# Patient Record
Sex: Female | Born: 1965 | Race: White | Hispanic: No | Marital: Married | Smoking: Never smoker
Health system: Southern US, Community
[De-identification: ages and names within clinical notes are randomized; demographics above are authoritative.]

## PROBLEM LIST (undated history)

## (undated) DIAGNOSIS — R195 Other fecal abnormalities: Secondary | ICD-10-CM

## (undated) DIAGNOSIS — K649 Unspecified hemorrhoids: Secondary | ICD-10-CM

## (undated) DIAGNOSIS — Z309 Encounter for contraceptive management, unspecified: Secondary | ICD-10-CM

## (undated) DIAGNOSIS — R350 Frequency of micturition: Principal | ICD-10-CM

## (undated) HISTORY — DX: Frequency of micturition: R35.0

## (undated) HISTORY — DX: Encounter for contraceptive management, unspecified: Z30.9

## (undated) HISTORY — DX: Other fecal abnormalities: R19.5

## (undated) HISTORY — DX: Unspecified hemorrhoids: K64.9

---

## 2000-05-24 ENCOUNTER — Other Ambulatory Visit: Admission: RE | Admit: 2000-05-24 | Discharge: 2000-05-24 | Payer: Self-pay | Admitting: Obstetrics and Gynecology

## 2000-07-12 ENCOUNTER — Other Ambulatory Visit: Admission: RE | Admit: 2000-07-12 | Discharge: 2000-07-12 | Payer: Self-pay | Admitting: Obstetrics and Gynecology

## 2001-05-24 ENCOUNTER — Encounter: Payer: Self-pay | Admitting: Obstetrics & Gynecology

## 2001-05-24 ENCOUNTER — Ambulatory Visit (HOSPITAL_COMMUNITY): Admission: RE | Admit: 2001-05-24 | Discharge: 2001-05-24 | Payer: Self-pay | Admitting: Obstetrics & Gynecology

## 2002-06-26 ENCOUNTER — Inpatient Hospital Stay (HOSPITAL_COMMUNITY): Admission: RE | Admit: 2002-06-26 | Discharge: 2002-06-28 | Payer: Self-pay | Admitting: Obstetrics & Gynecology

## 2006-08-12 ENCOUNTER — Ambulatory Visit (HOSPITAL_COMMUNITY): Admission: RE | Admit: 2006-08-12 | Discharge: 2006-08-12 | Payer: Self-pay | Admitting: Obstetrics & Gynecology

## 2007-01-16 ENCOUNTER — Other Ambulatory Visit: Admission: RE | Admit: 2007-01-16 | Discharge: 2007-01-16 | Payer: Self-pay | Admitting: Obstetrics and Gynecology

## 2007-01-18 ENCOUNTER — Ambulatory Visit (HOSPITAL_COMMUNITY): Admission: RE | Admit: 2007-01-18 | Discharge: 2007-01-18 | Payer: Self-pay | Admitting: Obstetrics & Gynecology

## 2007-09-12 ENCOUNTER — Ambulatory Visit (HOSPITAL_COMMUNITY): Admission: RE | Admit: 2007-09-12 | Discharge: 2007-09-12 | Payer: Self-pay | Admitting: Obstetrics & Gynecology

## 2008-04-05 ENCOUNTER — Other Ambulatory Visit: Admission: RE | Admit: 2008-04-05 | Discharge: 2008-04-05 | Payer: Self-pay | Admitting: Obstetrics and Gynecology

## 2008-09-20 ENCOUNTER — Ambulatory Visit (HOSPITAL_COMMUNITY): Admission: RE | Admit: 2008-09-20 | Discharge: 2008-09-20 | Payer: Self-pay | Admitting: Obstetrics & Gynecology

## 2009-07-24 ENCOUNTER — Other Ambulatory Visit: Admission: RE | Admit: 2009-07-24 | Discharge: 2009-07-24 | Payer: Self-pay | Admitting: Obstetrics and Gynecology

## 2009-09-23 ENCOUNTER — Ambulatory Visit (HOSPITAL_COMMUNITY): Admission: RE | Admit: 2009-09-23 | Discharge: 2009-09-23 | Payer: Self-pay | Admitting: Obstetrics & Gynecology

## 2010-06-19 NOTE — Discharge Summary (Signed)
   NAMECOOPER, STAMP                        ACCOUNT NO.:  [id number]   MEDICAL RECORD NO.:  [id number]                   PATIENT TYPE:  INP   LOCATION:  A427                                 FACILITY:  [facility]   PHYSICIAN:  Lazaro Arms, M.D.                DATE OF BIRTH:  03/18/1965   DATE OF ADMISSION:  06/26/2002  DATE OF DISCHARGE:  06/28/2002                                 DISCHARGE SUMMARY   DISCHARGE DIAGNOSES:  1. Status post a primary cesarean section.  2. Unremarkable postoperative course.   PROCEDURE:  Primary cesarean section.   HOSPITAL COURSE:  The patient was admitted postoperatively.  She had a  completely unremarkable postoperative course.  She tolerated clear liquids  and a regular diet.  She voided without difficulty.  She was ambulatory and  was passing gas.  Her incision was clean, dry, and intact.  Her hemoglobin  and hematocrit was stable.  She was afebrile.  She was discharged to home.   FOLLOW UP:  She is to be seen in the office next week to have her staples  removed.   DISCHARGE MEDICATIONS:  1. Tylox for pain.  2. Motrin for pain.   DISCHARGE INSTRUCTIONS:  Instructions and precautions to return prior to  that time.                                               Lazaro Arms, M.D.    Loraine Maple  D:  07/03/2002  T:  07/03/2002  Job:  [id number]

## 2010-06-19 NOTE — H&P (Signed)
   NAME:  Ashley Wong, Ashley Wong                        ACCOUNT NO.:  [id number]   MEDICAL RECORD NO.:  [id number]                   PATIENT TYPE:  AMB   LOCATION:  DAY                                  FACILITY:  [facility]   PHYSICIAN:  Lazaro Arms, M.D.                DATE OF BIRTH:  12-Jul-1965   DATE OF ADMISSION:  06/26/2002  DATE OF DISCHARGE:                                HISTORY & PHYSICAL   HISTORY OF PRESENT ILLNESS:  The patient is a 45 year old white female,  gravida 2, para 0, abortus 1. Estimated  date of delivery 07/03/2002, current  at 38-5/[redacted] weeks gestation. She is admitted for primary C section.  The  patient has a very narrow pubic arch and a  poor mid pelvis.  In fact, the  fetus is not even in the pelvis.  It is vertex, but can barely be touched.  As a result, feel it would be inappropriate to undergo a trial of labor.  We  discussed doing a CT pelvic index and felt that was no indicated because of  her incredibly small pelvic arch.  She is admitted for primary C section.  She understands the indications and agrees.   PAST MEDICAL HISTORY:  Negative.   PAST SURGICAL HISTORY:  Negative.   PAST OBSTETRICAL HISTORY:  She had a miscarriage in 2003.   ALLERGIES:  PENICILLIN.   MEDICATIONS:  Prenatal vitamins.   SOCIAL HISTORY:  She is married,  and she works in the school system.   REVIEW OF SYSTEMS:  Otherwise negative.   PRENATAL LABORATORY DATA:  Blood type is O positive.  Rubella is not immune.  HIV is nonreactive.  Hepatitis B is negative.  Serology is nonreactive.  GC  and Chlamydia were negative.  Pap was normal.  Glucola was 122.  Group B  strep was negative.   PHYSICAL EXAMINATION:  HEENT:  Unremarkable.  NECK:  Thyroid normal.  LUNGS:  Clear.  HEART:  Regular rate and rhythm without regurgitation or gallop.  BREASTS:  Without masses, discharge, skin changes.  ABDOMEN:  Gravid.  Fundal height 39 cm.  PELVIC:  Cervix long, thick, closed, high.  EXTREMITIES:   Warm with 2+ edema.  NEUROLOGIC:  Grossly intact.   IMPRESSION:  1. Intrauterine pregnancy at 38-5/[redacted] weeks gestation.  2. Inadequate pelvis for trial of labor.   PLAN:  The patient is admitted for a primary cesarean section.  She  understands the risks, benefits, indications, and alternatives and will  proceed.                                               Lazaro Arms, M.D.    Loraine Maple  D:  06/25/2002  T:  06/25/2002  Job:  [id number]

## 2010-06-19 NOTE — Op Note (Signed)
NAME:  Ashley Wong, Ashley Wong                        ACCOUNT NO.:  [id number]   MEDICAL RECORD NO.:  [id number]                   PATIENT TYPE:  AMB   LOCATION:  DAY                                  FACILITY:  [facility]   PHYSICIAN:  Lazaro Arms, M.D.                DATE OF BIRTH:  1965-06-05   DATE OF PROCEDURE:  06/26/2002  DATE OF DISCHARGE:                                 OPERATIVE REPORT   PREOPERATIVE DIAGNOSES:  1. Intrauterine pregnancy at 38-5/[redacted] weeks gestation.  2. Inadequate pelvis.   POSTOPERATIVE DIAGNOSES:  1. Intrauterine pregnancy at 38-5/[redacted] weeks gestation.  2. Inadequate pelvis.   PROCEDURE:  Primary low transverse cesarean section.   SURGEON:  Lazaro Arms, M.D.   ANESTHESIA:  Spinal.   FINDINGS:  Over a low transverse hysterotomy incision was delivered a viable  female infant with Apgars of 9 and 9, with weight to be determined in the  nursery.  The placenta was normal.  There was a three-vessel cord.  Cord  blood and cord gas were sent.  The infant underwent routine neonatal  resuscitation in the operating room and taken to the nursery in good and  stable condition.   DESCRIPTION OF PROCEDURE:  The patient was taken to the operating room and  placed in the supine position where she underwent spinal anesthetic.  She  was then placed in the supine position with the roll under her right hip.  She was prepped and draped in the usual sterile fashion.   A Pfannenstiel skin incision was made and carried down sharply to the rectus  fascia which was scored in the midline and extended laterally.  The fascia  was taken off of the muscles superiorly and inferiorly without difficulty.  The muscles were divided.  The peritoneal cavity was entered.  A bladder  blade was placed, and the vesicouterine serosal flap was created.  A low  transverse hysterotomy incision was made, and over this incision was  delivered a viable female with Apgars of 9 and 9, with weight to be  determined in the nursery.  The placenta was normal and intact and delivered  spontaneously.  Cord blood and cord gas were sent.  The infant was handed to  [facility], R.N. who was in attendance for routine neonatal  resuscitation.  The uterus was exteriorized and wiped clean with a clean lap  pad.  It was closed in two layers, the first being a running interlocking  layer and the second being an embrocating layer which was also interlocking.  Interrupted sutures were placed for additional hemostasis.  The uterus was  replaced in the peritoneal cavity.  Both pericolic gutters were irrigated,  and the uterine incision was hemostatic.  The muscles were reapproximated  loosely.  The fascia was closed using 0 Vicryl running.  The subcutaneous  tissue was made hemostatic and irrigated.  The skin was closed using  skin  staples.   The patient tolerated the procedure well.  She experienced 600 cc of blood  loss and was taken to the recovery room in good and stable condition.  All  counts were correct x3.                                               Lazaro Arms, M.D.    Loraine Maple  D:  06/26/2002  T:  06/26/2002  Job:  [id number]

## 2010-08-26 ENCOUNTER — Other Ambulatory Visit: Payer: Self-pay | Admitting: Obstetrics & Gynecology

## 2010-08-26 DIAGNOSIS — Z139 Encounter for screening, unspecified: Secondary | ICD-10-CM

## 2010-09-25 ENCOUNTER — Ambulatory Visit (HOSPITAL_COMMUNITY)
Admission: RE | Admit: 2010-09-25 | Discharge: 2010-09-25 | Disposition: A | Discharge status: Home / Self Care | Payer: BC Managed Care – PPO | Source: Ambulatory Visit | Attending: Obstetrics & Gynecology | Admitting: Obstetrics & Gynecology

## 2010-09-25 DIAGNOSIS — Z1231 Encounter for screening mammogram for malignant neoplasm of breast: Secondary | ICD-10-CM | POA: Insufficient documentation

## 2010-09-25 DIAGNOSIS — Z139 Encounter for screening, unspecified: Secondary | ICD-10-CM

## 2011-03-18 DIAGNOSIS — R55 Syncope and collapse: Secondary | ICD-10-CM

## 2011-03-18 DIAGNOSIS — R0989 Other specified symptoms and signs involving the circulatory and respiratory systems: Secondary | ICD-10-CM

## 2011-07-27 ENCOUNTER — Other Ambulatory Visit (HOSPITAL_COMMUNITY)
Admission: RE | Admit: 2011-07-27 | Discharge: 2011-07-27 | Disposition: A | Discharge status: Home / Self Care | Payer: BC Managed Care – PPO | Source: Ambulatory Visit | Attending: Obstetrics and Gynecology | Admitting: Obstetrics and Gynecology

## 2011-07-27 DIAGNOSIS — Z01419 Encounter for gynecological examination (general) (routine) without abnormal findings: Secondary | ICD-10-CM | POA: Insufficient documentation

## 2011-07-27 DIAGNOSIS — Z1159 Encounter for screening for other viral diseases: Secondary | ICD-10-CM | POA: Insufficient documentation

## 2011-08-19 ENCOUNTER — Other Ambulatory Visit: Payer: Self-pay | Admitting: Obstetrics & Gynecology

## 2011-08-19 DIAGNOSIS — Z139 Encounter for screening, unspecified: Secondary | ICD-10-CM

## 2011-09-30 ENCOUNTER — Ambulatory Visit (HOSPITAL_COMMUNITY)
Admission: RE | Admit: 2011-09-30 | Discharge: 2011-09-30 | Disposition: A | Discharge status: Home / Self Care | Payer: BC Managed Care – PPO | Source: Ambulatory Visit | Attending: Obstetrics & Gynecology | Admitting: Obstetrics & Gynecology

## 2011-09-30 DIAGNOSIS — Z1231 Encounter for screening mammogram for malignant neoplasm of breast: Secondary | ICD-10-CM | POA: Insufficient documentation

## 2011-09-30 DIAGNOSIS — Z139 Encounter for screening, unspecified: Secondary | ICD-10-CM

## 2012-08-07 ENCOUNTER — Other Ambulatory Visit: Payer: Self-pay | Admitting: Adult Health

## 2012-10-11 ENCOUNTER — Other Ambulatory Visit: Payer: Self-pay | Admitting: Obstetrics & Gynecology

## 2012-10-11 DIAGNOSIS — Z139 Encounter for screening, unspecified: Secondary | ICD-10-CM

## 2012-10-12 ENCOUNTER — Ambulatory Visit (HOSPITAL_COMMUNITY)
Admission: RE | Admit: 2012-10-12 | Discharge: 2012-10-12 | Disposition: A | Discharge status: Home / Self Care | Payer: BC Managed Care – PPO | Source: Ambulatory Visit | Attending: Obstetrics & Gynecology | Admitting: Obstetrics & Gynecology

## 2012-10-12 DIAGNOSIS — Z139 Encounter for screening, unspecified: Secondary | ICD-10-CM

## 2012-10-12 DIAGNOSIS — Z1231 Encounter for screening mammogram for malignant neoplasm of breast: Secondary | ICD-10-CM | POA: Insufficient documentation

## 2013-08-08 ENCOUNTER — Other Ambulatory Visit: Payer: Self-pay | Admitting: Adult Health

## 2013-10-17 ENCOUNTER — Other Ambulatory Visit: Payer: Self-pay | Admitting: Adult Health

## 2013-10-22 ENCOUNTER — Other Ambulatory Visit: Payer: Self-pay | Admitting: Obstetrics & Gynecology

## 2013-10-22 DIAGNOSIS — Z139 Encounter for screening, unspecified: Secondary | ICD-10-CM

## 2013-11-12 ENCOUNTER — Ambulatory Visit (HOSPITAL_COMMUNITY)
Admission: RE | Admit: 2013-11-12 | Discharge: 2013-11-12 | Disposition: A | Discharge status: Home / Self Care | Payer: BC Managed Care – PPO | Source: Ambulatory Visit | Attending: Obstetrics & Gynecology | Admitting: Obstetrics & Gynecology

## 2013-11-12 DIAGNOSIS — Z1231 Encounter for screening mammogram for malignant neoplasm of breast: Secondary | ICD-10-CM | POA: Insufficient documentation

## 2013-11-12 DIAGNOSIS — Z139 Encounter for screening, unspecified: Secondary | ICD-10-CM

## 2013-11-19 ENCOUNTER — Other Ambulatory Visit (HOSPITAL_COMMUNITY)
Admission: RE | Admit: 2013-11-19 | Discharge: 2013-11-19 | Disposition: A | Discharge status: Home / Self Care | Payer: BC Managed Care – PPO | Source: Ambulatory Visit | Attending: Adult Health | Admitting: Adult Health

## 2013-11-19 ENCOUNTER — Encounter: Payer: Self-pay | Admitting: Adult Health

## 2013-11-19 ENCOUNTER — Ambulatory Visit (INDEPENDENT_AMBULATORY_CARE_PROVIDER_SITE_OTHER): Payer: BC Managed Care – PPO | Admitting: Adult Health

## 2013-11-19 VITALS — BP 100/70 | HR 78 | Ht 59.25 in | Wt 178.0 lb

## 2013-11-19 DIAGNOSIS — Z309 Encounter for contraceptive management, unspecified: Secondary | ICD-10-CM | POA: Insufficient documentation

## 2013-11-19 DIAGNOSIS — Z1212 Encounter for screening for malignant neoplasm of rectum: Secondary | ICD-10-CM

## 2013-11-19 DIAGNOSIS — Z01419 Encounter for gynecological examination (general) (routine) without abnormal findings: Secondary | ICD-10-CM

## 2013-11-19 DIAGNOSIS — Z1151 Encounter for screening for human papillomavirus (HPV): Secondary | ICD-10-CM | POA: Diagnosis present

## 2013-11-19 DIAGNOSIS — Z3041 Encounter for surveillance of contraceptive pills: Secondary | ICD-10-CM

## 2013-11-19 HISTORY — DX: Encounter for contraceptive management, unspecified: Z30.9

## 2013-11-19 LAB — HEMOCCULT GUIAC POC 1CARD (OFFICE): Fecal Occult Blood, POC: NEGATIVE

## 2013-11-19 MED ORDER — LEVONORG-ETH ESTRAD TRIPHASIC PO TABS: ORAL_TABLET | ORAL | Status: DC

## 2013-11-19 NOTE — Patient Instructions (Signed)
Physical in 1 year Mammogram yearly  Labs at work Flu shot tomorrow  Continue OCs Colonoscopy at 76

## 2013-11-19 NOTE — Progress Notes (Signed)
Patient ID: Ashley Wong, female   DOB: 10-Dec-1965, 48 y.o.   MRN: [id number] History of Present Illness: Ashley Wong is a 48 year old white female,married in for a pap and physical.She is happy with her pills.   Current Medications, Allergies, Past Medical History, Past Surgical History, Family History and Social History were reviewed in [employer] record.     Review of Systems: Patient denies any headaches, blurred vision, shortness of breath, chest pain, abdominal pain, problems with bowel movements, urination, or intercourse. No mood swings, has pain in left hip at times and that leg is slightly shorter.    Physical Exam:BP 100/70  Pulse 78  Ht 4' 11.25" (1.505 m)  Wt 178 lb (80.74 kg)  BMI 35.65 kg/m2  LMP 11/02/2013 General:  Well developed, well nourished, no acute distress Skin:  Warm and dry Neck:  Midline trachea, normal thyroid Lungs; Clear to auscultation bilaterally Breast:  No dominant palpable mass, retraction, or nipple discharge Cardiovascular: Regular rate and rhythm Abdomen:  Soft, non tender, no hepatosplenomegaly Pelvic:  External genitalia is normal in appearance.  The vagina is normal in appearance.  The cervix is smooth and pap performed with HPV,slightly friable.  Uterus is felt to be normal size, shape, and contour.  No  adnexal masses or tenderness noted. Rectal: Good sphincter tone, no polyps, or hemorrhoids felt.  Hemoccult negative.mild rectocele. Extremities:  No swelling or varicosities noted Psych:  No mood changes,alert and cooperative,seems happy   Impression: Well woman exam with pap Contraceptive management    Plan: Physical exam in 1 year Pap in 3 years if HPV negative Mammogram yearly Colonoscopy at 59 Getting flu shot at work tomorrow Labs at work Refilled enpresse x 1 year

## 2013-11-20 LAB — CYTOLOGY - PAP

## 2013-12-03 ENCOUNTER — Encounter: Payer: Self-pay | Admitting: Adult Health

## 2013-12-18 ENCOUNTER — Ambulatory Visit (INDEPENDENT_AMBULATORY_CARE_PROVIDER_SITE_OTHER): Payer: BC Managed Care – PPO | Admitting: Adult Health

## 2013-12-18 ENCOUNTER — Encounter: Payer: Self-pay | Admitting: Adult Health

## 2013-12-18 VITALS — BP 118/78 | Temp 98.0°F | Ht 60.0 in

## 2013-12-18 DIAGNOSIS — N390 Urinary tract infection, site not specified: Secondary | ICD-10-CM

## 2013-12-18 DIAGNOSIS — R350 Frequency of micturition: Secondary | ICD-10-CM

## 2013-12-18 DIAGNOSIS — R319 Hematuria, unspecified: Secondary | ICD-10-CM

## 2013-12-18 HISTORY — DX: Frequency of micturition: R35.0

## 2013-12-18 LAB — POCT URINALYSIS DIPSTICK
Glucose, UA: NEGATIVE
Nitrite, UA: NEGATIVE

## 2013-12-18 MED ORDER — NITROFURANTOIN MONOHYD MACRO 100 MG PO CAPS: 100.0000 mg | ORAL_CAPSULE | Freq: Two times a day (BID) | ORAL | Status: DC

## 2013-12-18 NOTE — Patient Instructions (Signed)
Take macrobid  Increase fluids Urinary Tract Infection Urinary tract infections (UTIs) can develop anywhere along your urinary tract. Your urinary tract is your body's drainage system for removing wastes and extra water. Your urinary tract includes two kidneys, two ureters, a bladder, and a urethra. Your kidneys are a pair of bean-shaped organs. Each kidney is about the size of your fist. They are located below your ribs, one on each side of your spine. CAUSES Infections are caused by microbes, which are microscopic organisms, including fungi, viruses, and bacteria. These organisms are so small that they can only be seen through a microscope. Bacteria are the microbes that most commonly cause UTIs. SYMPTOMS  Symptoms of UTIs may vary by age and gender of the patient and by the location of the infection. Symptoms in young women typically include a frequent and intense urge to urinate and a painful, burning feeling in the bladder or urethra during urination. Older women and men are more likely to be tired, shaky, and weak and have muscle aches and abdominal pain. A fever may mean the infection is in your kidneys. Other symptoms of a kidney infection include pain in your back or sides below the ribs, nausea, and vomiting. DIAGNOSIS To diagnose a UTI, your caregiver will ask you about your symptoms. Your caregiver also will ask to provide a urine sample. The urine sample will be tested for bacteria and white blood cells. White blood cells are made by your body to help fight infection. TREATMENT  Typically, UTIs can be treated with medication. Because most UTIs are caused by a bacterial infection, they usually can be treated with the use of antibiotics. The choice of antibiotic and length of treatment depend on your symptoms and the type of bacteria causing your infection. HOME CARE INSTRUCTIONS  If you were prescribed antibiotics, take them exactly as your caregiver instructs you. Finish the medication  even if you feel better after you have only taken some of the medication.  Drink enough water and fluids to keep your urine clear or pale yellow.  Avoid caffeine, tea, and carbonated beverages. They tend to irritate your bladder.  Empty your bladder often. Avoid holding urine for long periods of time.  Empty your bladder before and after sexual intercourse.  After a bowel movement, women should cleanse from front to back. Use each tissue only once. SEEK MEDICAL CARE IF:   You have back pain.  You develop a fever.  Your symptoms do not begin to resolve within 3 days. SEEK IMMEDIATE MEDICAL CARE IF:   You have severe back pain or lower abdominal pain.  You develop chills.  You have nausea or vomiting.  You have continued burning or discomfort with urination. MAKE SURE YOU:   Understand these instructions.  Will watch your condition.  Will get help right away if you are not doing well or get worse. Document Released: 10/28/2004 Document Revised: 07/20/2011 Document Reviewed: 02/26/2011 [facility] Patient Information 2015 [place], [place]. This information is not intended to replace advice given to you by your health care provider. Make sure you discuss any questions you have with your health care provider. Follow up prn

## 2013-12-18 NOTE — Progress Notes (Signed)
Subjective:     Patient ID: Ashley Wong, female   DOB: [birth year], 48 y.o.   MRN: [id number]  HPI Ashley Wong is a 48 year old white female,married, in complaining of urinary frequency x 1 week, more so in last 2 days with some back pain.No fever.   Review of Systems See HPI Reviewed past medical,surgical, social and family history. Reviewed medications and allergies.     Objective:   Physical Exam BP 118/78 mmHg  Temp(Src) 98 F (36.7 C)  Ht 5' (1.524 m)  LMP 10/30/2015Declines weight.   Urine 2+ leuks,trace protein,1+ blood, No CVAT but has twinge there, Skin warm and dry.Pelvic: external genitalia is normal in appearance, vagina: scant discharge without odor, cervix:smooth, uterus: normal size, shape and contour, non tender, no masses felt, adnexa: no masses or tenderness noted.Has pressure over bladder.  Assessment:     Urinary frequency Hematuria UTI    Plan:     UA C&S sent Rx macrobid 1 bid x 7 days Increase fluids Review handout on UTI

## 2013-12-19 LAB — URINALYSIS
BILIRUBIN URINE: NEGATIVE
GLUCOSE, UA: NEGATIVE mg/dL
HGB URINE DIPSTICK: NEGATIVE
Ketones, ur: NEGATIVE mg/dL
Nitrite: NEGATIVE
PROTEIN: NEGATIVE mg/dL
Specific Gravity, Urine: 1.024 (ref 1.005–1.030)
Urobilinogen, UA: 0.2 mg/dL (ref 0.0–1.0)
pH: 7 (ref 5.0–8.0)

## 2013-12-21 ENCOUNTER — Telehealth: Payer: Self-pay | Admitting: Adult Health

## 2013-12-21 LAB — URINE CULTURE: Colony Count: 80000

## 2013-12-21 MED ORDER — CIPROFLOXACIN HCL 500 MG PO TABS: 500.0000 mg | ORAL_TABLET | Freq: Two times a day (BID) | ORAL | Status: DC

## 2013-12-21 NOTE — Telephone Encounter (Signed)
Left message that urine culture back not sensitive to macrobid, called cipro in to layne's stop macrobid

## 2014-01-07 ENCOUNTER — Telehealth: Payer: Self-pay | Admitting: Adult Health

## 2014-01-07 NOTE — Telephone Encounter (Signed)
Complains of UTI symptoms again, to come in am at 8:30

## 2014-01-08 ENCOUNTER — Encounter: Payer: Self-pay | Admitting: Adult Health

## 2014-01-08 ENCOUNTER — Ambulatory Visit (INDEPENDENT_AMBULATORY_CARE_PROVIDER_SITE_OTHER): Payer: BC Managed Care – PPO | Admitting: Adult Health

## 2014-01-08 VITALS — BP 110/80 | Ht 60.0 in | Wt 170.0 lb

## 2014-01-08 DIAGNOSIS — R300 Dysuria: Secondary | ICD-10-CM

## 2014-01-08 DIAGNOSIS — R319 Hematuria, unspecified: Secondary | ICD-10-CM

## 2014-01-08 LAB — POCT URINALYSIS DIPSTICK
GLUCOSE UA: NEGATIVE
Ketones, UA: NEGATIVE
Leukocytes, UA: NEGATIVE
NITRITE UA: NEGATIVE
Protein, UA: NEGATIVE

## 2014-01-08 MED ORDER — CIPROFLOXACIN HCL 500 MG PO TABS: 500.0000 mg | ORAL_TABLET | Freq: Two times a day (BID) | ORAL | Status: DC

## 2014-01-08 NOTE — Progress Notes (Signed)
Subjective:     Patient ID: Ashley Wong, female   DOB: 03-13-1965, 48 y.o.   MRN: [id number]  HPI Ashley Wong is a 48 year old white female, married in complaining of some back pain and burning with urination, was recently treated for UTI with cipro, culture was Klebsiella pneumoniae.  Review of Systems See HPI Reviewed past medical,surgical, social and family history. Reviewed medications and allergies.     Objective:   Physical Exam BP 110/80 mmHg  Ht 5' (1.524 m)  Wt 170 lb (77.111 kg)  BMI 33.20 kg/m2  LMP 11/27/2015urine trace blood, No CVAT, she says it feels like it did last time.    Assessment:     Burning with urination  Hematuria     Plan:     UA C&S sent Push fluids Rx cipro 500 mg 1 bid x 10 days #20 Follow up prn

## 2014-01-08 NOTE — Patient Instructions (Signed)
Hematuria Hematuria is blood in your urine. It can be caused by a bladder infection, kidney infection, prostate infection, kidney stone, or cancer of your urinary tract. Infections can usually be treated with medicine, and a kidney stone usually will pass through your urine. If neither of these is the cause of your hematuria, further workup to find out the reason may be needed. It is very important that you tell your health care provider about any blood you see in your urine, even if the blood stops without treatment or happens without causing pain. Blood in your urine that happens and then stops and then happens again can be a symptom of a very serious condition. Also, pain is not a symptom in the initial stages of many urinary cancers. HOME CARE INSTRUCTIONS   Drink lots of fluid, 3-4 quarts a day. If you have been diagnosed with an infection, cranberry juice is especially recommended, in addition to large amounts of water.  Avoid caffeine, tea, and carbonated beverages because they tend to irritate the bladder.  Avoid alcohol because it may irritate the prostate.  Take all medicines as directed by your health care provider.  If you were prescribed an antibiotic medicine, finish it all even if you start to feel better.  If you have been diagnosed with a kidney stone, follow your health care provider's instructions regarding straining your urine to catch the stone.  Empty your bladder often. Avoid holding urine for long periods of time.  After a bowel movement, women should cleanse front to back. Use each tissue only once.  Empty your bladder before and after sexual intercourse if you are a female. SEEK MEDICAL CARE IF:  You develop back pain.  You have a fever.  You have a feeling of sickness in your stomach (nausea) or vomiting.  Your symptoms are not better in 3 days. Return sooner if you are getting worse. SEEK IMMEDIATE MEDICAL CARE IF:   You develop severe vomiting and are  unable to keep the medicine down.  You develop severe back or abdominal pain despite taking your medicines.  You begin passing a large amount of blood or clots in your urine.  You feel extremely weak or faint, or you pass out. MAKE SURE YOU:   Understand these instructions.  Will watch your condition.  Will get help right away if you are not doing well or get worse. Document Released: 01/18/2005 Document Revised: 06/04/2013 Document Reviewed: 09/18/2012 [facility] Patient Information 2015 [place], [place]. This information is not intended to replace advice given to you by your health care provider. Make sure you discuss any questions you have with your health care provider. Push fluids Take cipro

## 2014-01-09 LAB — URINALYSIS
Bilirubin Urine: NEGATIVE
Glucose, UA: NEGATIVE mg/dL
HGB URINE DIPSTICK: NEGATIVE
KETONES UR: NEGATIVE mg/dL
NITRITE: NEGATIVE
PROTEIN: NEGATIVE mg/dL
SPECIFIC GRAVITY, URINE: 1.025 (ref 1.005–1.030)
UROBILINOGEN UA: 0.2 mg/dL (ref 0.0–1.0)
pH: 5.5 (ref 5.0–8.0)

## 2014-01-11 LAB — URINE CULTURE

## 2014-01-14 ENCOUNTER — Telehealth: Payer: Self-pay | Admitting: Adult Health

## 2014-01-14 NOTE — Telephone Encounter (Signed)
Left message that culture + Klebsiella and that cipro should cover it, lets get F/U culture about 4 days after finished meds

## 2014-09-04 ENCOUNTER — Other Ambulatory Visit: Payer: Self-pay | Admitting: Adult Health

## 2014-12-05 ENCOUNTER — Other Ambulatory Visit: Payer: Self-pay | Admitting: Obstetrics & Gynecology

## 2014-12-05 DIAGNOSIS — Z1231 Encounter for screening mammogram for malignant neoplasm of breast: Secondary | ICD-10-CM

## 2014-12-20 ENCOUNTER — Ambulatory Visit (HOSPITAL_COMMUNITY)
Admission: RE | Admit: 2014-12-20 | Discharge: 2014-12-20 | Disposition: A | Discharge status: Home / Self Care | Payer: BC Managed Care – PPO | Source: Ambulatory Visit | Attending: Obstetrics & Gynecology | Admitting: Obstetrics & Gynecology

## 2014-12-20 DIAGNOSIS — Z1231 Encounter for screening mammogram for malignant neoplasm of breast: Secondary | ICD-10-CM | POA: Insufficient documentation

## 2015-08-18 ENCOUNTER — Other Ambulatory Visit: Payer: Self-pay | Admitting: Adult Health

## 2015-10-02 ENCOUNTER — Other Ambulatory Visit: Payer: Self-pay | Admitting: Adult Health

## 2015-10-13 ENCOUNTER — Encounter: Payer: Self-pay | Admitting: Adult Health

## 2015-10-13 ENCOUNTER — Ambulatory Visit (INDEPENDENT_AMBULATORY_CARE_PROVIDER_SITE_OTHER): Payer: BC Managed Care – PPO | Admitting: Adult Health

## 2015-10-13 VITALS — BP 120/80 | HR 78 | Ht 59.0 in | Wt 180.5 lb

## 2015-10-13 DIAGNOSIS — R195 Other fecal abnormalities: Secondary | ICD-10-CM | POA: Diagnosis not present

## 2015-10-13 DIAGNOSIS — Z01419 Encounter for gynecological examination (general) (routine) without abnormal findings: Secondary | ICD-10-CM | POA: Diagnosis not present

## 2015-10-13 DIAGNOSIS — Z3041 Encounter for surveillance of contraceptive pills: Secondary | ICD-10-CM

## 2015-10-13 DIAGNOSIS — Z1211 Encounter for screening for malignant neoplasm of colon: Secondary | ICD-10-CM | POA: Diagnosis not present

## 2015-10-13 HISTORY — DX: Other fecal abnormalities: R19.5

## 2015-10-13 LAB — HEMOCCULT GUIAC POC 1CARD (OFFICE): FECAL OCCULT BLD: POSITIVE — AB

## 2015-10-13 MED ORDER — MYZILRA 50-30/75-40/ 125-30 MCG PO TABS: 1.0000 | ORAL_TABLET | Freq: Every day | ORAL | 12 refills | Status: DC

## 2015-10-13 NOTE — Addendum Note (Signed)
Addended by: Derrek Monaco A on: 10/13/2015 09:31 AM   Modules accepted: Orders

## 2015-10-13 NOTE — Patient Instructions (Addendum)
Physical in 1 year Pap in 2018 Mammogram yearly Do 3 cards and bring back\ Perimenopause Perimenopause is the time when your body begins to move into the menopause (no menstrual period for 12 straight months). It is a natural process. Perimenopause can begin 2-8 years before the menopause and usually lasts for 1 year after the menopause. During this time, your ovaries may or may not produce an egg. The ovaries vary in their production of estrogen and progesterone hormones each month. This can cause irregular menstrual periods, difficulty getting pregnant, vaginal bleeding between periods, and uncomfortable symptoms. CAUSES  Irregular production of the ovarian hormones, estrogen and progesterone, and not ovulating every month.  Other causes include:  Tumor of the pituitary gland in the brain.  Medical disease that affects the ovaries.  Radiation treatment.  Chemotherapy.  Unknown causes.  Heavy smoking and excessive alcohol intake can bring on perimenopause sooner. SIGNS AND SYMPTOMS   Hot flashes.  Night sweats.  Irregular menstrual periods.  Decreased sex drive.  Vaginal dryness.  Headaches.  Mood swings.  Depression.  Memory problems.  Irritability.  Tiredness.  Weight gain.  Trouble getting pregnant.  The beginning of losing bone cells (osteoporosis).  The beginning of hardening of the arteries (atherosclerosis). DIAGNOSIS  Your health care provider will make a diagnosis by analyzing your age, menstrual history, and symptoms. He or she will do a physical exam and note any changes in your body, especially your female organs. Female hormone tests may or may not be helpful depending on the amount of female hormones you produce and when you produce them. However, other hormone tests may be helpful to rule out other problems. TREATMENT  In some cases, no treatment is needed. The decision on whether treatment is necessary during the perimenopause should be made  by you and your health care provider based on how the symptoms are affecting you and your lifestyle. Various treatments are available, such as:  Treating individual symptoms with a specific medicine for that symptom.  Herbal medicines that can help specific symptoms.  Counseling.  Group therapy. HOME CARE INSTRUCTIONS   Keep track of your menstrual periods (when they occur, how heavy they are, how long between periods, and how long they last) as well as your symptoms and when they started.  Only take over-the-counter or prescription medicines as directed by your health care provider.  Sleep and rest.  Exercise.  Eat a diet that contains calcium (good for your bones) and soy (acts like the estrogen hormone).  Do not smoke.  Avoid alcoholic beverages.  Take vitamin supplements as recommended by your health care provider. Taking vitamin E may help in certain cases.  Take calcium and vitamin D supplements to help prevent bone loss.  Group therapy is sometimes helpful.  Acupuncture may help in some cases. SEEK MEDICAL CARE IF:   You have questions about any symptoms you are having.  You need a referral to a specialist (gynecologist, psychiatrist, or psychologist). SEEK IMMEDIATE MEDICAL CARE IF:   You have vaginal bleeding.  Your period lasts longer than 8 days.  Your periods are recurring sooner than 21 days.  You have bleeding after intercourse.  You have severe depression.  You have pain when you urinate.  You have severe headaches.  You have vision problems.   This information is not intended to replace advice given to you by your health care provider. Make sure you discuss any questions you have with your health care provider.   Document  Released: 02/26/2004 Document Revised: 02/08/2014 Document Reviewed: 08/17/2012 Elsevier Interactive Patient Education [employer].

## 2015-10-13 NOTE — Progress Notes (Addendum)
Patient ID: Ashley Wong, female   DOB: 25-Mar-1965, 50 y.o.   MRN: [id number] History of Present Illness: Ashley Wong is a 50 year old white female, married in for a well woman gyn exam, she had a normal pap 11/19/13.   Current Medications, Allergies, Past Medical History, Past Surgical History, Family History and Social History were reviewed in [employer] record.     Review of Systems: Patient denies any headaches, hearing loss, fatigue, blurred vision, shortness of breath, chest pain, abdominal pain, problems with bowel movements(she does have constipation at times), urination, or intercourse. No joint pain or mood swings.    Physical Exam:BP 120/80 (BP Location: Left Arm, Patient Position: Sitting, Cuff Size: Normal)   Pulse 78   Ht 4\' 11"  (1.499 m)   Wt 180 lb 8 oz (81.9 kg)   LMP 10/04/2015 (Exact Date)   BMI 36.46 kg/m  General:  Well developed, well nourished, no acute distress Skin:  Warm and dry Neck:  Midline trachea, normal thyroid, good ROM, no lymphadenopathy Lungs; Clear to auscultation bilaterally Breast:  No dominant palpable mass, retraction, or nipple discharge Cardiovascular: Regular rate and rhythm Abdomen:  Soft, non tender, no hepatosplenomegaly Pelvic:  External genitalia is normal in appearance, no lesions.  The vagina is normal in appearance. Urethra has no lesions or masses. The cervix is bulbous.  Uterus is felt to be normal size, shape, and contour.  No adnexal masses or tenderness noted.Bladder is non tender, no masses felt. Rectal: Good sphincter tone, no polyps, + hemorrhoids felt.  Hemoccult positive. Extremities/musculoskeletal:  No swelling or varicosities noted, no clubbing or cyanosis Psych:  No mood changes, alert and cooperative,seems happy   Impression: 1. Well female exam with routine gynecological exam   2. Encounter for surveillance of contraceptive pills   3. Fecal occult blood test positive       Plan: Check  CBC,CMP,TSH and lipids,A1c and vitamin D Sent home with 3 hemoccult cards to do Physical and pap in 1 year Mammogram yearly Colonoscopy at 41, unless any of the 3 cards are + Refilled myzilra x 1 year, take 1 daily

## 2015-10-14 LAB — LIPID PANEL
CHOLESTEROL TOTAL: 195 mg/dL (ref 100–199)
Chol/HDL Ratio: 5.1 ratio units — ABNORMAL HIGH (ref 0.0–4.4)
HDL: 38 mg/dL — ABNORMAL LOW (ref >39)
LDL Calculated: 125 mg/dL — ABNORMAL HIGH (ref 0–99)
Triglycerides: 159 mg/dL — ABNORMAL HIGH (ref 0–149)
VLDL CHOLESTEROL CAL: 32 mg/dL (ref 5–40)

## 2015-10-14 LAB — HEMOGLOBIN A1C
ESTIMATED AVERAGE GLUCOSE: 111 mg/dL
HEMOGLOBIN A1C: 5.5 % (ref 4.8–5.6)

## 2015-10-14 LAB — CBC
HEMATOCRIT: 44.1 % (ref 34.0–46.6)
Hemoglobin: 15.1 g/dL (ref 11.1–15.9)
MCH: 30.6 pg (ref 26.6–33.0)
MCHC: 34.2 g/dL (ref 31.5–35.7)
MCV: 89 fL (ref 79–97)
Platelets: 293 10*3/uL (ref 150–379)
RBC: 4.94 x10E6/uL (ref 3.77–5.28)
RDW: 13 % (ref 12.3–15.4)
WBC: 9.1 10*3/uL (ref 3.4–10.8)

## 2015-10-14 LAB — COMPREHENSIVE METABOLIC PANEL
ALK PHOS: 77 IU/L (ref 39–117)
ALT: 16 IU/L (ref 0–32)
AST: 15 IU/L (ref 0–40)
Albumin/Globulin Ratio: 1.6 (ref 1.2–2.2)
Albumin: 4.6 g/dL (ref 3.5–5.5)
BILIRUBIN TOTAL: 0.5 mg/dL (ref 0.0–1.2)
BUN/Creatinine Ratio: 12 (ref 9–23)
BUN: 10 mg/dL (ref 6–24)
CHLORIDE: 99 mmol/L (ref 96–106)
CO2: 24 mmol/L (ref 18–29)
CREATININE: 0.86 mg/dL (ref 0.57–1.00)
Calcium: 9.8 mg/dL (ref 8.7–10.2)
GFR calc Af Amer: 92 mL/min/{1.73_m2} (ref >59)
GFR calc non Af Amer: 80 mL/min/{1.73_m2} (ref >59)
GLOBULIN, TOTAL: 2.8 g/dL (ref 1.5–4.5)
Glucose: 87 mg/dL (ref 65–99)
POTASSIUM: 4.6 mmol/L (ref 3.5–5.2)
SODIUM: 140 mmol/L (ref 134–144)
Total Protein: 7.4 g/dL (ref 6.0–8.5)

## 2015-10-14 LAB — TSH: TSH: 1.67 u[IU]/mL (ref 0.450–4.500)

## 2015-10-14 LAB — VITAMIN D 25 HYDROXY (VIT D DEFICIENCY, FRACTURES): VIT D 25 HYDROXY: 30.9 ng/mL (ref 30.0–100.0)

## 2015-10-15 ENCOUNTER — Telehealth: Payer: Self-pay | Admitting: Adult Health

## 2015-10-15 NOTE — Telephone Encounter (Signed)
Pt aware of labs, increase activity, decrease carbs, take vitamin D3 1000  IU daily and recheck labs in 1 year

## 2015-10-21 ENCOUNTER — Telehealth: Payer: Self-pay | Admitting: Adult Health

## 2015-10-21 ENCOUNTER — Other Ambulatory Visit: Payer: Self-pay | Admitting: Adult Health

## 2015-10-21 ENCOUNTER — Other Ambulatory Visit (INDEPENDENT_AMBULATORY_CARE_PROVIDER_SITE_OTHER): Payer: BC Managed Care – PPO

## 2015-10-21 DIAGNOSIS — Z1212 Encounter for screening for malignant neoplasm of rectum: Secondary | ICD-10-CM

## 2015-10-21 DIAGNOSIS — R195 Other fecal abnormalities: Secondary | ICD-10-CM

## 2015-10-21 LAB — HEMOCCULT GUIAC POC 1CARD (OFFICE)
Card #3 Fecal Occult Blood, POC: NEGATIVE
FECAL OCCULT BLD: NEGATIVE
FECAL OCCULT BLD: POSITIVE — AB

## 2015-10-21 NOTE — Progress Notes (Signed)
Pt brought hemocult cards by office. JAG checked cards and 1 was positive and 2 was negative. JAG to refer to GI doc. [place]

## 2015-10-21 NOTE — Telephone Encounter (Signed)
Pt had 1 of 3 + hemoccult cards, will refer to Dr Gala Romney for colonoscopy

## 2015-10-23 ENCOUNTER — Encounter: Payer: Self-pay | Admitting: Gastroenterology

## 2015-11-13 ENCOUNTER — Other Ambulatory Visit: Payer: Self-pay

## 2015-11-13 ENCOUNTER — Ambulatory Visit (INDEPENDENT_AMBULATORY_CARE_PROVIDER_SITE_OTHER): Payer: BC Managed Care – PPO | Admitting: Gastroenterology

## 2015-11-13 ENCOUNTER — Encounter: Payer: Self-pay | Admitting: Gastroenterology

## 2015-11-13 VITALS — BP 136/89 | HR 105 | Temp 98.2°F | Ht 60.0 in | Wt 182.4 lb

## 2015-11-13 DIAGNOSIS — Z800 Family history of malignant neoplasm of digestive organs: Secondary | ICD-10-CM

## 2015-11-13 DIAGNOSIS — K5900 Constipation, unspecified: Secondary | ICD-10-CM | POA: Diagnosis not present

## 2015-11-13 DIAGNOSIS — R195 Other fecal abnormalities: Secondary | ICD-10-CM

## 2015-11-13 MED ORDER — NA SULFATE-K SULFATE-MG SULF 17.5-3.13-1.6 GM/177ML PO SOLN: 1.0000 | ORAL | 0 refills | Status: DC

## 2015-11-13 NOTE — Progress Notes (Addendum)
REVIEWED-NO ADDITIONAL RECOMMENDATIONS.  Primary Care Physician:  Glenda Chroman, MD  Primary Gastroenterologist:  Barney Drain, MD   Chief Complaint  Patient presents with  . Blood In Stools    1 out of 3 positive (was constipated)    HPI:  Ashley Wong is a 50 y.o. female here for consideration of colonoscopy for heme + stools. Patient states she has chronic intermittent constipation. Takes natural laxative as needed. Irven [place] couple of times per year. The day of her DRE, she had hard stool. She was heme+. Also completed 3 take on cards, one was positive. No rectal pain or abd pain. Appetite is good. No heartburn/dysphagia. Mother had colon cancer age 52.    Current Outpatient Prescriptions  Medication Sig Dispense Refill  . cholecalciferol (VITAMIN D) 1000 units tablet Take 1,000 Units by mouth daily.    Marland Kitchen MYZILRA tablet Take 1 tablet by mouth daily. 1 Package 12  . Omega-3 Fatty Acids (FISH OIL) 1000 MG CAPS Take 1 capsule by mouth daily.     No current facility-administered medications for this visit.     Allergies as of 11/13/2015 - Review Complete 11/13/2015  Allergen Reaction Noted  . Penicillins Anaphylaxis 11/19/2013  . Zithromax [azithromycin] Nausea And Vomiting 11/19/2013  . Sulfa antibiotics Rash 11/19/2013    Past Medical History:  Diagnosis Date  . Contraceptive management 11/19/2013  . Fecal occult blood test positive 10/13/2015   Will sent 3 cards home  . Hemorrhoids   . Urinary frequency 12/18/2013    Past Surgical History:  Procedure Laterality Date  . CESAREAN SECTION  2004    Family History  Problem Relation Age of Onset  . Heart disease Mother     Social History   Social History  . Marital status: Married    Spouse name: N/A  . Number of children: N/A  . Years of education: N/A   Occupational History  . Not on file.   Social History Main Topics  . Smoking status: Never Smoker  . Smokeless tobacco: Never Used  . Alcohol use No  .  Drug use: No  . Sexual activity: Yes    Birth control/ protection: Pill   Other Topics Concern  . Not on file   Social History Narrative  . No narrative on file      ROS:  General: Negative for anorexia, weight loss, fever, chills, fatigue, weakness. Eyes: Negative for vision changes.  ENT: Negative for hoarseness, difficulty swallowing , nasal congestion. CV: Negative for chest pain, angina, palpitations, dyspnea on exertion, peripheral edema.  Respiratory: Negative for dyspnea at rest, dyspnea on exertion, cough, sputum, wheezing.  GI: See history of present illness. GU:  Negative for dysuria, hematuria, urinary incontinence, urinary frequency, nocturnal urination.  MS: Negative for joint pain, low back pain.  Derm: Negative for rash or itching.  Neuro: Negative for weakness, abnormal sensation, seizure, frequent headaches, memory loss, confusion.  Psych: Negative for anxiety, depression, suicidal ideation, hallucinations.  Endo: Negative for unusual weight change.  Heme: Negative for bruising or bleeding. Allergy: Negative for rash or hives.    Physical Examination:  BP 136/89   Pulse (!) 105   Temp 98.2 F (36.8 C) (Oral)   Ht 5' (1.524 m)   Wt 182 lb 6.4 oz (82.7 kg)   LMP 10/03/2015 (Exact Date)   BMI 35.62 kg/m    General: Well-nourished, well-developed in no acute distress.  Head: Normocephalic, atraumatic.   Eyes: Conjunctiva pink, no icterus. Mouth: Oropharyngeal  mucosa moist and pink , no lesions erythema or exudate. Neck: Supple without thyromegaly, masses, or lymphadenopathy.  Lungs: Clear to auscultation bilaterally.  Heart: Regular rate and rhythm, no murmurs rubs or gallops.  Abdomen: Bowel sounds are normal, nontender, nondistended, no hepatosplenomegaly or masses, no abdominal bruits or    hernia , no rebound or guarding.   Rectal: deferred Extremities: No lower extremity edema. No clubbing or deformities.  Neuro: Alert and oriented x 4 , grossly  normal neurologically.  Skin: Warm and dry, no rash or jaundice.   Psych: Alert and cooperative, normal mood and affect.  Labs: Lab Results  Component Value Date   CREATININE 0.86 10/13/2015   BUN 10 10/13/2015   NA 140 10/13/2015   K 4.6 10/13/2015   CL 99 10/13/2015   CO2 24 10/13/2015   Lab Results  Component Value Date   ALT 16 10/13/2015   AST 15 10/13/2015   ALKPHOS 77 10/13/2015   BILITOT 0.5 10/13/2015   Lab Results  Component Value Date   WBC 9.1 10/13/2015   HCT 44.1 10/13/2015   MCV 89 10/13/2015   PLT 293 10/13/2015     Imaging Studies: No results found.

## 2015-11-13 NOTE — Patient Instructions (Signed)
1. Take Linzess once daily on empty stomach for constiaption, beginning four days prior to your procedure. Samples provided.  2. Colonoscopy as scheduled. See separate instructions.

## 2015-11-14 NOTE — Assessment & Plan Note (Signed)
50 y/o female with heme+stool, intermittent constipation, FH CRC mother age 6. No prior TCS. Heme + stool could be secondary to benign anorectal source in setting of constipation but would recommend further evaluation. Colonoscopy in near future with Dr. Oneida Alar.  I have discussed the risks, alternatives, benefits with regards to but not limited to the risk of reaction to medication, bleeding, infection, perforation and the patient is agreeable to proceed. Written consent to be obtained.  Continue natural laxative as needed. Samples of Linzess provided to begin several days before bowel prep for managmnet of constipation.

## 2015-11-17 NOTE — Progress Notes (Signed)
cc'ed to pcp °

## 2015-11-25 ENCOUNTER — Other Ambulatory Visit: Payer: Self-pay | Admitting: Obstetrics & Gynecology

## 2015-11-25 DIAGNOSIS — Z1231 Encounter for screening mammogram for malignant neoplasm of breast: Secondary | ICD-10-CM

## 2015-12-05 ENCOUNTER — Encounter (HOSPITAL_COMMUNITY): Payer: Self-pay

## 2015-12-05 ENCOUNTER — Encounter (HOSPITAL_COMMUNITY)
Admission: RE | Disposition: A | Discharge status: Home / Self Care | Payer: Self-pay | Source: Ambulatory Visit | Attending: Gastroenterology

## 2015-12-05 ENCOUNTER — Ambulatory Visit (HOSPITAL_COMMUNITY)
Admission: RE | Admit: 2015-12-05 | Discharge: 2015-12-05 | Disposition: A | Discharge status: Home / Self Care | Payer: BC Managed Care – PPO | Source: Ambulatory Visit | Attending: Gastroenterology | Admitting: Gastroenterology

## 2015-12-05 DIAGNOSIS — Z800 Family history of malignant neoplasm of digestive organs: Secondary | ICD-10-CM

## 2015-12-05 DIAGNOSIS — D125 Benign neoplasm of sigmoid colon: Secondary | ICD-10-CM | POA: Diagnosis not present

## 2015-12-05 DIAGNOSIS — R195 Other fecal abnormalities: Secondary | ICD-10-CM | POA: Diagnosis present

## 2015-12-05 DIAGNOSIS — K648 Other hemorrhoids: Secondary | ICD-10-CM | POA: Diagnosis not present

## 2015-12-05 DIAGNOSIS — Z1212 Encounter for screening for malignant neoplasm of rectum: Secondary | ICD-10-CM

## 2015-12-05 DIAGNOSIS — D123 Benign neoplasm of transverse colon: Secondary | ICD-10-CM | POA: Insufficient documentation

## 2015-12-05 DIAGNOSIS — D124 Benign neoplasm of descending colon: Secondary | ICD-10-CM | POA: Insufficient documentation

## 2015-12-05 DIAGNOSIS — D122 Benign neoplasm of ascending colon: Secondary | ICD-10-CM | POA: Diagnosis not present

## 2015-12-05 DIAGNOSIS — K644 Residual hemorrhoidal skin tags: Secondary | ICD-10-CM | POA: Diagnosis not present

## 2015-12-05 DIAGNOSIS — K635 Polyp of colon: Secondary | ICD-10-CM

## 2015-12-05 DIAGNOSIS — Z880 Allergy status to penicillin: Secondary | ICD-10-CM | POA: Diagnosis not present

## 2015-12-05 DIAGNOSIS — Z1211 Encounter for screening for malignant neoplasm of colon: Secondary | ICD-10-CM

## 2015-12-05 HISTORY — PX: COLONOSCOPY: SHX5424

## 2015-12-05 HISTORY — PX: POLYPECTOMY: SHX5525

## 2015-12-05 SURGERY — COLONOSCOPY
Anesthesia: Moderate Sedation

## 2015-12-05 MED ORDER — STERILE WATER FOR IRRIGATION IR SOLN
Status: DC | PRN
  Administered 2015-12-05: 16:00:00

## 2015-12-05 MED ORDER — SODIUM CHLORIDE 0.9 % IV SOLN
INTRAVENOUS | Status: DC
  Administered 2015-12-05: 1000 mL via INTRAVENOUS

## 2015-12-05 MED ORDER — MIDAZOLAM HCL 5 MG/5ML IJ SOLN
INTRAMUSCULAR | Status: DC | PRN
  Administered 2015-12-05 (×2): 2 mg via INTRAVENOUS
  Administered 2015-12-05: 1 mg via INTRAVENOUS
  Administered 2015-12-05: 2 mg via INTRAVENOUS

## 2015-12-05 MED ORDER — MEPERIDINE HCL 100 MG/ML IJ SOLN
INTRAMUSCULAR | Status: DC | PRN
  Administered 2015-12-05 (×4): 25 mg via INTRAVENOUS

## 2015-12-05 MED ORDER — MEPERIDINE HCL 100 MG/ML IJ SOLN
INTRAMUSCULAR | Status: AC
  Filled 2015-12-05: qty 2

## 2015-12-05 MED ORDER — MIDAZOLAM HCL 5 MG/5ML IJ SOLN
INTRAMUSCULAR | Status: AC
  Filled 2015-12-05: qty 10

## 2015-12-05 NOTE — H&P (Signed)
  Primary Care Physician:  Glenda Chroman, MD Primary Gastroenterologist:  Dr. Oneida Alar  Pre-Procedure History & Physical: HPI:  Ashley Wong is a 50 y.o. female here for HEME POS STOOLS-nl Hb Sep 2017. FAMILY Hx COLON CA-MOTHER HAD COLON CA AGE > 60.   Past Medical History:  Diagnosis Date  . Contraceptive management 11/19/2013  . Fecal occult blood test positive 10/13/2015   Will sent 3 cards home  . Hemorrhoids   . Urinary frequency 12/18/2013    Past Surgical History:  Procedure Laterality Date  . CESAREAN SECTION  2004    Prior to Admission medications   Medication Sig Start Date End Date Taking? Authorizing Provider  cholecalciferol (VITAMIN D) 1000 units tablet Take 1,000 Units by mouth daily.   Yes Historical Provider, MD  [facility] tablet Take 1 tablet by mouth daily. 10/13/15  Yes Estill Dooms, NP  Na Sulfate-K Sulfate-Mg Sulf (SUPREP BOWEL PREP KIT) 17.5-3.13-1.6 GM/180ML SOLN Take 1 kit by mouth as directed. 11/13/15  Yes Danie Binder, MD  Omega-3 Fatty Acids (FISH OIL) 1000 MG CAPS Take 1 capsule by mouth daily.   Yes Historical Provider, MD    Allergies as of 11/13/2015 - Review Complete 11/13/2015  Allergen Reaction Noted  . Penicillins Anaphylaxis 11/19/2013  . Zithromax [azithromycin] Nausea And Vomiting 11/19/2013  . Sulfa antibiotics Rash 11/19/2013    Family History  Problem Relation Age of Onset  . Heart disease Mother   . Colon cancer Mother 20    s/p surgery, no adjuvent therapy    Social History   Social History  . Marital status: Married    Spouse name: N/A  . Number of children: N/A  . Years of education: N/A   Occupational History  . Not on file.   Social History Main Topics  . Smoking status: Never Smoker  . Smokeless tobacco: Never Used  . Alcohol use No  . Drug use: No  . Sexual activity: Yes    Birth control/ protection: Pill   Other Topics Concern  . Not on file   Social History Narrative   Teacher    Review of  Systems: See HPI, otherwise negative ROS   Physical Exam: BP (!) 122/95   Pulse (!) 133   Temp 98.4 F (36.9 C) (Oral)   Resp 19   Ht 5' (1.524 m)   Wt 180 lb (81.6 kg)   LMP 11/28/2015 (Exact Date)   SpO2 99%   BMI 35.15 kg/m  General:   Alert,  pleasant and cooperative in NAD Head:  Normocephalic and atraumatic. Neck:  Supple; Lungs:  Clear throughout to auscultation.    Heart:  Regular rate and rhythm. Abdomen:  Soft, nontender and nondistended. Normal bowel sounds, without guarding, and without rebound.   Neurologic:  Alert and  oriented x4;  grossly normal neurologically.  Impression/Plan:     HEME POS STOOLS-nl Hb Sep 2017 HEME POS STOOLS-nl Hb Sep 2017 PLAN:  1.TCS TODAY. DISCUSSED PROCEDURE, BENEFITS, & RISKS: < 1% chance of medication reaction, bleeding, perforation, or rupture of spleen/liver.

## 2015-12-05 NOTE — Discharge Instructions (Signed)
THE BLOOD DETECTED IN YOUR STOOL WAS DUE TO POLYPS AND HEMORRHOIDS. You had 6 polyps removed. You have SMALL internal  AND MODERATE EXTERNAL HEMORRHOIDS. THE LAST PART OF YOU SMALL BOWEL IS NORMAL.    CONTINUE YOUR WEIGHT LOSS EFFORTS. LOSE TEN POUNDS. YOUR BODY MASS INDEX IS OVER 30 WHICH MEANS YOU ARE OBESE. OBESITY IS ASSOCIATED WITH AN INCREASE FOR ALL CANCERS, INCLUDING BREAST, ESOPHAGEAL, AND COLON CANCER.  DRINK WATER TO KEEP YOUR URINE LIGHT YELLOW.  FOLLOW A HIGH FIBER DIET. AVOID ITEMS THAT CAUSE BLOATING & GAS. SEE INFO BELOW.  YOUR BIOPSY RESULTS WILL BE AVAILABLE IN MY CHART AFTER NOV 7 AND MY OFFICE WILL CONTACT YOU IN 10-14 DAYS WITH YOUR RESULTS.   USE PREPARATION H FOUR TIMES  A DAY IF NEEDED TO RELIEVE RECTAL PAIN/PRESSURE/BLEEDING.  Next colonoscopy in 3 years.    Colonoscopy Care After Read the instructions outlined below and refer to this sheet in the next week. These discharge instructions provide you with general information on caring for yourself after you leave the hospital. While your treatment has been planned according to the most current medical practices available, unavoidable complications occasionally occur. If you have any problems or questions after discharge, call DR. Joscelyne Renville, [phone].  ACTIVITY  You may resume your regular activity, but move at a slower pace for the next 24 hours.   Take frequent rest periods for the next 24 hours.   Walking will help get rid of the air and reduce the bloated feeling in your belly (abdomen).   No driving for 24 hours (because of the medicine (anesthesia) used during the test).   You may shower.   Do not sign any important legal documents or operate any machinery for 24 hours (because of the anesthesia used during the test).    NUTRITION  Drink plenty of fluids.   You may resume your normal diet as instructed by your doctor.   Begin with a light meal and progress to your normal diet. Heavy or fried  foods are harder to digest and may make you feel sick to your stomach (nauseated).   Avoid alcoholic beverages for 24 hours or as instructed.    MEDICATIONS  You may resume your normal medications.   WHAT YOU CAN EXPECT TODAY  Some feelings of bloating in the abdomen.   Passage of more gas than usual.   Spotting of blood in your stool or on the toilet paper  .  IF YOU HAD POLYPS REMOVED DURING THE COLONOSCOPY:  Eat a soft diet IF YOU HAVE NAUSEA, BLOATING, ABDOMINAL PAIN, OR VOMITING.    FINDING OUT THE RESULTS OF YOUR TEST Not all test results are available during your visit. DR. Oneida Alar WILL CALL YOU WITHIN 14 DAYS OF YOUR PROCEDUE WITH YOUR RESULTS. Do not assume everything is normal if you have not heard from DR. Nuala Chiles, CALL HER OFFICE AT [phone].  SEEK IMMEDIATE MEDICAL ATTENTION AND CALL THE OFFICE: [phone] IF:  You have more than a spotting of blood in your stool.   Your belly is swollen (abdominal distention).   You are nauseated or vomiting.   You have a temperature over 101F.   You have abdominal pain or discomfort that is severe or gets worse throughout the day.   High-Fiber Diet A high-fiber diet changes your normal diet to include more whole grains, legumes, fruits, and vegetables. Changes in the diet involve replacing refined carbohydrates with unrefined foods. The calorie level of the diet is essentially unchanged.  The Dietary Reference Intake (recommended amount) for adult males is 38 grams per day. For adult females, it is 25 grams per day. Pregnant and lactating women should consume 28 grams of fiber per day. Fiber is the intact part of a plant that is not broken down during digestion. Functional fiber is fiber that has been isolated from the plant to provide a beneficial effect in the body. PURPOSE  Increase stool bulk.   Ease and regulate bowel movements.   Lower cholesterol.   REDUCE RISK OF COLON CANCER  INDICATIONS THAT YOU NEED  MORE FIBER  Constipation and hemorrhoids.   Uncomplicated diverticulosis (intestine condition) and irritable bowel syndrome.   Weight management.   As a protective measure against hardening of the arteries (atherosclerosis), diabetes, and cancer.   GUIDELINES FOR INCREASING FIBER IN THE DIET  Start adding fiber to the diet slowly. A gradual increase of about 5 more grams (2 slices of whole-wheat bread, 2 servings of most fruits or vegetables, or 1 bowl of high-fiber cereal) per day is best. Too rapid an increase in fiber may result in constipation, flatulence, and bloating.   Drink enough water and fluids to keep your urine clear or pale yellow. Water, juice, or caffeine-free drinks are recommended. Not drinking enough fluid may cause constipation.   Eat a variety of high-fiber foods rather than one type of fiber.   Try to increase your intake of fiber through using high-fiber foods rather than fiber pills or supplements that contain small amounts of fiber.   The goal is to change the types of food eaten. Do not supplement your present diet with high-fiber foods, but replace foods in your present diet.   INCLUDE A VARIETY OF FIBER SOURCES  Replace refined and processed grains with whole grains, canned fruits with fresh fruits, and incorporate other fiber sources. White rice, white breads, and most bakery goods contain little or no fiber.   Brown whole-grain rice, buckwheat oats, and many fruits and vegetables are all good sources of fiber. These include: broccoli, Brussels sprouts, cabbage, cauliflower, beets, sweet potatoes, white potatoes (skin on), carrots, tomatoes, eggplant, squash, berries, fresh fruits, and dried fruits.   Cereals appear to be the richest source of fiber. Cereal fiber is found in whole grains and bran. Bran is the fiber-rich outer coat of cereal grain, which is largely removed in refining. In whole-grain cereals, the bran remains. In breakfast cereals, the largest  amount of fiber is found in those with "bran" in their names. The fiber content is sometimes indicated on the label.   You may need to include additional fruits and vegetables each day.   In baking, for 1 cup white flour, you may use the following substitutions:   1 cup whole-wheat flour minus 2 tablespoons.   1/2 cup white flour plus 1/2 cup whole-wheat flour.   Polyps, Colon  A polyp is extra tissue that grows inside your body. Colon polyps grow in the large intestine. The large intestine, also called the colon, is part of your digestive system. It is a long, hollow tube at the end of your digestive tract where your body makes and stores stool. Most polyps are not dangerous. They are benign. This means they are not cancerous. But over time, some types of polyps can turn into cancer. Polyps that are smaller than a pea are usually not harmful. But larger polyps could someday become or may already be cancerous. To be safe, doctors remove all polyps and test them.  PREVENTION There is not one sure way to prevent polyps. You might be able to lower your risk of getting them if you:  Eat more fruits and vegetables and less fatty food.   Do not smoke.   Avoid alcohol.   Exercise every day.   Lose weight if you are overweight.   Eating more calcium and folate can also lower your risk of getting polyps. Some foods that are rich in calcium are milk, cheese, and broccoli. Some foods that are rich in folate are chickpeas, kidney beans, and spinach.   Hemorrhoids Hemorrhoids are dilated (enlarged) veins around the rectum. Sometimes clots will form in the veins. This makes them swollen and painful. These are called thrombosed hemorrhoids. Causes of hemorrhoids include:  Constipation.   Straining to have a bowel movement.   HEAVY LIFTING  HOME CARE INSTRUCTIONS  Eat a well balanced diet and drink 6 to 8 glasses of water every day to avoid constipation. You may also use a bulk laxative.     Avoid straining to have bowel movements.   Keep anal area dry and clean.   Do not use a donut shaped pillow or sit on the toilet for long periods. This increases blood pooling and pain.   Move your bowels when your body has the urge; this will require less straining and will decrease pain and pressure.

## 2015-12-05 NOTE — Op Note (Signed)
[facility] Patient Name: Ashley Wong Procedure Date: 12/05/2015 3:43 PM MRN: [id number] Date of Birth: 06-May-1965 Attending MD: Barney Drain , MD CSN: [id number] Age: 50 Admit Type: Outpatient Procedure:                Colonoscopy with COLD FORCEPS AND SNARE POLYPECTOMY Indications:              Family history of colon cancer in a first-degree                            relative, Heme positive stool Providers:                Barney Drain, MD, Otis Peak B. Sharon Seller, RN, Bonnetta Barry, Technician Referring MD:             Glenda Chroman Medicines:                Meperidine 100 mg IV, Midazolam 7 mg IV Complications:            No immediate complications. Estimated Blood Loss:     Estimated blood loss was minimal. Procedure:                Pre-Anesthesia Assessment:                           - Prior to the procedure, a History and Physical                            was performed, and patient medications and                            allergies were reviewed. The patient's tolerance of                            previous anesthesia was also reviewed. The risks                            and benefits of the procedure and the sedation                            options and risks were discussed with the patient.                            All questions were answered, and informed consent                            was obtained. Prior Anticoagulants: The patient has                            taken aspirin, last dose was day of procedure. ASA                            Grade Assessment: II - A patient with mild systemic  disease. After reviewing the risks and benefits,                            the patient was deemed in satisfactory condition to                            undergo the procedure. After obtaining informed                            consent, the colonoscope was passed under direct                            vision.  Throughout the procedure, the patient's                            blood pressure, pulse, and oxygen saturations were                            monitored continuously. The [id number]                            scope was introduced through the anus and advanced                            to the 10 cm into the ileum. The terminal ileum,                            ileocecal valve, appendiceal orifice, and rectum                            were photographed. The colonoscopy was somewhat                            difficult due to a tortuous colon. Successful                            completion of the procedure was aided by increasing                            the dose of sedation medication, straightening and                            shortening the scope to obtain bowel loop                            reduction, applying abdominal pressure and                            COLOWRAP. The patient tolerated the procedure                            fairly well. The quality of the bowel preparation  was excellent. Scope In: 3:51:35 PM Scope Out: 4:12:52 PM Scope Withdrawal Time: 0 hours 14 minutes 51 seconds  Total Procedure Duration: 0 hours 21 minutes 17 seconds  Findings:      The digital rectal exam findings include non-thrombosed external       hemorrhoids.      Five sessile polyps were found in the sigmoid colon, proximal descending       colon, proximal transverse colon, hepatic flexure and ascending colon.       The polyps were 2 to 4 mm in size. These polyps were removed with a cold       biopsy forceps. Resection and retrieval were complete.      A 5 mm polyp was found in the sigmoid colon. The polyp was sessile. The       polyp was removed with a hot snare. Resection and retrieval were       complete.      Non-bleeding internal hemorrhoids were found during retroflexion. The       hemorrhoids were small.      External hemorrhoids were found. The  hemorrhoids were moderate. Impression:               - Non-thrombosed external hemorrhoids found on                            digital rectal exam.                           - Five 2 to 4 mm polyps in the sigmoid colon, in                            the proximal descending colon, in the proximal                            transverse colon, at the hepatic flexure and in the                            ascending colon, removed with a cold biopsy                            forceps. Resected and retrieved.                           - One 5 mm polyp in the sigmoid colon, removed with                            a hot snare. Resected and retrieved.                           - Non-bleeding internal hemorrhoids.                           - External hemorrhoids. Moderate Sedation:      Moderate (conscious) sedation was administered by the endoscopy nurse       and supervised by the endoscopist. The following parameters were       monitored: oxygen saturation, heart rate, blood pressure, and response  to care. Total physician intraservice time was 33 minutes. Recommendation:           - High fiber diet.                           - Continue present medications.                           - Await pathology results.                           - Repeat colonoscopy in 3 years for surveillance.                           - Patient has a contact number available for                            emergencies. The signs and symptoms of potential                            delayed complications were discussed with the                            patient. Return to normal activities tomorrow.                            Written discharge instructions were provided to the                            patient. Procedure Code(s):        --- Professional ---                           [phone], Colonoscopy, flexible; with removal of                            tumor(s), polyp(s), or other lesion(s) by snare                             technique                           45380, 59, Colonoscopy, flexible; with biopsy,                            single or multiple                           99152, Moderate sedation services provided by the                            same physician or other qualified health care                            professional performing the diagnostic or  therapeutic service that the sedation supports,                            requiring the presence of an independent trained                            observer to assist in the monitoring of the                            patient's level of consciousness and physiological                            status; initial 15 minutes of intraservice time,                            patient age 45 years or older                           [phone], Moderate sedation services; each additional                            15 minutes intraservice time Diagnosis Code(s):        --- Professional ---                           D12.5, Benign neoplasm of sigmoid colon                           D12.4, Benign neoplasm of descending colon                           D12.3, Benign neoplasm of transverse colon (hepatic                            flexure or splenic flexure)                           D12.2, Benign neoplasm of ascending colon                           K64.4, Residual hemorrhoidal skin tags                           K64.8, Other hemorrhoids                           Z80.0, Family history of malignant neoplasm of                            digestive organs                           R19.5, Other fecal abnormalities CPT copyright 2016 [employer]. All rights reserved. The codes documented in this report are preliminary and upon coder review may  be revised to meet current compliance requirements. Barney Drain, MD Barney Drain, MD 12/05/2015 4:24:27  PM This report has been signed electronically. Number of Addenda:  0

## 2015-12-11 ENCOUNTER — Encounter (HOSPITAL_COMMUNITY): Payer: Self-pay | Admitting: Gastroenterology

## 2015-12-31 ENCOUNTER — Ambulatory Visit (HOSPITAL_COMMUNITY): Payer: BC Managed Care – PPO

## 2016-01-05 ENCOUNTER — Ambulatory Visit (HOSPITAL_COMMUNITY)
Admission: RE | Admit: 2016-01-05 | Discharge: 2016-01-05 | Disposition: A | Discharge status: Home / Self Care | Payer: BC Managed Care – PPO | Source: Ambulatory Visit | Attending: Obstetrics & Gynecology | Admitting: Obstetrics & Gynecology

## 2016-01-05 DIAGNOSIS — Z1231 Encounter for screening mammogram for malignant neoplasm of breast: Secondary | ICD-10-CM | POA: Diagnosis present

## 2016-10-01 ENCOUNTER — Other Ambulatory Visit: Payer: Self-pay | Admitting: Adult Health

## 2016-10-25 ENCOUNTER — Other Ambulatory Visit: Payer: BC Managed Care – PPO | Admitting: Adult Health

## 2016-12-06 ENCOUNTER — Other Ambulatory Visit (HOSPITAL_COMMUNITY)
Admission: RE | Admit: 2016-12-06 | Discharge: 2016-12-06 | Disposition: A | Discharge status: Home / Self Care | Payer: BC Managed Care – PPO | Source: Ambulatory Visit | Attending: Adult Health | Admitting: Adult Health

## 2016-12-06 ENCOUNTER — Ambulatory Visit (INDEPENDENT_AMBULATORY_CARE_PROVIDER_SITE_OTHER): Payer: BC Managed Care – PPO | Admitting: Adult Health

## 2016-12-06 ENCOUNTER — Other Ambulatory Visit: Payer: BC Managed Care – PPO | Admitting: Adult Health

## 2016-12-06 ENCOUNTER — Encounter: Payer: Self-pay | Admitting: Adult Health

## 2016-12-06 VITALS — BP 130/78 | HR 85 | Resp 16 | Ht 60.0 in | Wt 184.0 lb

## 2016-12-06 DIAGNOSIS — Z01419 Encounter for gynecological examination (general) (routine) without abnormal findings: Secondary | ICD-10-CM | POA: Insufficient documentation

## 2016-12-06 DIAGNOSIS — Z1151 Encounter for screening for human papillomavirus (HPV): Secondary | ICD-10-CM | POA: Diagnosis not present

## 2016-12-06 DIAGNOSIS — Z3041 Encounter for surveillance of contraceptive pills: Secondary | ICD-10-CM

## 2016-12-06 DIAGNOSIS — E782 Mixed hyperlipidemia: Secondary | ICD-10-CM | POA: Diagnosis not present

## 2016-12-06 DIAGNOSIS — Z1211 Encounter for screening for malignant neoplasm of colon: Secondary | ICD-10-CM

## 2016-12-06 DIAGNOSIS — Z1212 Encounter for screening for malignant neoplasm of rectum: Secondary | ICD-10-CM | POA: Diagnosis not present

## 2016-12-06 LAB — HEMOCCULT GUIAC POC 1CARD (OFFICE): FECAL OCCULT BLD: NEGATIVE

## 2016-12-06 MED ORDER — LEVONORG-ETH ESTRAD TRIPHASIC PO TABS: ORAL_TABLET | ORAL | 12 refills | Status: DC

## 2016-12-06 NOTE — Progress Notes (Signed)
Patient ID: Ashley Wong, female   DOB: Jul 25, 1965, 51 y.o.   MRN: [id number] History of Present Illness: Ashley Wong is a 51 year old white female, married in for a well woman gyn exam and pap. PCP is Dr Woody Seller.    Current Medications, Allergies, Past Medical History, Past Surgical History, Family History and Social History were reviewed in [employer] record.     Review of Systems: Patient denies any headaches, hearing loss, fatigue, blurred vision, shortness of breath, chest pain, abdominal pain, problems with bowel movements, urination, or intercourse. No joint pain or mood swings.    Physical Exam:BP 130/78 (BP Location: Left Arm, Patient Position: Sitting, Cuff Size: Normal)   Pulse 85   Resp 16   Ht 5' (1.524 m)   Wt 184 lb (83.5 kg)   LMP 11/27/2016 (Exact Date)   BMI 35.94 kg/m  General:  Well developed, well nourished, no acute distress Skin:  Warm and dry Neck:  Midline trachea, normal thyroid, good ROM, no lymphadenopathy Lungs; Clear to auscultation bilaterally Breast:  No dominant palpable mass, retraction, or nipple discharge Cardiovascular: Regular rate and rhythm Abdomen:  Soft, non tender, no hepatosplenomegaly Pelvic:  External genitalia is normal in appearance, no lesions.  The vagina is normal in appearance. Urethra has no lesions or masses. The cervix is bulbous. Pap with HPV performed  Uterus is felt to be normal size, shape, and contour.  No adnexal masses or tenderness noted.Bladder is non tender, no masses felt. Rectal: Good sphincter tone, no polyps, or hemorrhoids felt.  Hemoccult negative. Extremities/musculoskeletal:  No swelling or varicosities noted, no clubbing or cyanosis Psych:  No mood changes, alert and cooperative,seems happy PHQ 2 score 0.  Impression:  1. Encounter for gynecological examination with Papanicolaou smear of cervix   2. Encounter for surveillance of contraceptive pills   3. Screening for colorectal cancer    4. Elevated cholesterol with elevated triglycerides      Plan: Check lipids and CMP Refilled enpresse x 1 year Physical in 1 year Pap in 3 if normal Mammogram yearly

## 2016-12-07 LAB — CYTOLOGY - PAP
ADEQUACY: ABSENT
DIAGNOSIS: NEGATIVE
HPV (WINDOPATH): NOT DETECTED

## 2016-12-21 ENCOUNTER — Other Ambulatory Visit: Payer: Self-pay | Admitting: Obstetrics & Gynecology

## 2016-12-21 DIAGNOSIS — Z1231 Encounter for screening mammogram for malignant neoplasm of breast: Secondary | ICD-10-CM

## 2017-01-05 ENCOUNTER — Ambulatory Visit (HOSPITAL_COMMUNITY)
Admission: RE | Admit: 2017-01-05 | Discharge: 2017-01-05 | Disposition: A | Discharge status: Home / Self Care | Payer: BC Managed Care – PPO | Source: Ambulatory Visit | Attending: Obstetrics & Gynecology | Admitting: Obstetrics & Gynecology

## 2017-01-05 ENCOUNTER — Ambulatory Visit (HOSPITAL_COMMUNITY): Payer: BC Managed Care – PPO

## 2017-01-05 DIAGNOSIS — Z1231 Encounter for screening mammogram for malignant neoplasm of breast: Secondary | ICD-10-CM | POA: Insufficient documentation

## 2017-11-22 ENCOUNTER — Other Ambulatory Visit: Payer: Self-pay | Admitting: Adult Health

## 2017-12-20 ENCOUNTER — Encounter: Payer: Self-pay | Admitting: Adult Health

## 2017-12-20 ENCOUNTER — Ambulatory Visit (INDEPENDENT_AMBULATORY_CARE_PROVIDER_SITE_OTHER): Payer: BC Managed Care – PPO | Admitting: Adult Health

## 2017-12-20 VITALS — BP 129/84 | HR 82 | Ht 60.0 in | Wt 183.0 lb

## 2017-12-20 DIAGNOSIS — Z131 Encounter for screening for diabetes mellitus: Secondary | ICD-10-CM | POA: Insufficient documentation

## 2017-12-20 DIAGNOSIS — Z01419 Encounter for gynecological examination (general) (routine) without abnormal findings: Secondary | ICD-10-CM | POA: Diagnosis not present

## 2017-12-20 DIAGNOSIS — Z1211 Encounter for screening for malignant neoplasm of colon: Secondary | ICD-10-CM

## 2017-12-20 DIAGNOSIS — Z1212 Encounter for screening for malignant neoplasm of rectum: Secondary | ICD-10-CM | POA: Diagnosis not present

## 2017-12-20 DIAGNOSIS — E782 Mixed hyperlipidemia: Secondary | ICD-10-CM | POA: Insufficient documentation

## 2017-12-20 DIAGNOSIS — Z3041 Encounter for surveillance of contraceptive pills: Secondary | ICD-10-CM

## 2017-12-20 DIAGNOSIS — Z1321 Encounter for screening for nutritional disorder: Secondary | ICD-10-CM | POA: Insufficient documentation

## 2017-12-20 LAB — HEMOCCULT GUIAC POC 1CARD (OFFICE): FECAL OCCULT BLD: NEGATIVE

## 2017-12-20 MED ORDER — LEVONORG-ETH ESTRAD TRIPHASIC PO TABS: ORAL_TABLET | ORAL | 4 refills | Status: DC

## 2017-12-20 NOTE — Progress Notes (Signed)
Patient ID: Ashley Wong, female   DOB: 1965/04/08, 52 y.o.   MRN: [id number] History of Present Illness: Ashley Wong is a 53 year old white female, married, G2P1 in for well woman gyn exam, she had a normal pap with negtive HPV 12/07/16. PCP is Dr Woody Seller.    Current Medications, Allergies, Past Medical History, Past Surgical History, Family History and Social History were reviewed in [employer] record.     Review of Systems:  Patient denies any headaches, hearing loss, fatigue, blurred vision, shortness of breath, chest pain, abdominal pain, problems with bowel movements, urination, or intercourse. No joint pain or mood swings. Periods still monthly on OCs. Left hip aches at times, says left leg slightly shorter.She exercised 5/7 days.   Physical Exam:BP 129/84 (BP Location: Left Arm, Patient Position: Sitting, Cuff Size: Normal)   Pulse 82   Ht 5' (1.524 m)   Wt 183 lb (83 kg)   LMP 11/26/2017 (Exact Date)   BMI 35.74 kg/m  General:  Well developed, well nourished, no acute distress Skin:  Warm and dry Neck:  Midline trachea, normal thyroid, good ROM, no lymphadenopathy Lungs; Clear to auscultation bilaterally Breast:  No dominant palpable mass, retraction, or nipple discharge Cardiovascular: Regular rate and rhythm Abdomen:  Soft, non tender, no hepatosplenomegaly Pelvic:  External genitalia is normal in appearance, no lesions.  The vagina is normal in appearance. Urethra has no lesions or masses. The cervix is bulbous.  Uterus is felt to be normal size, shape, and contour.  No adnexal masses or tenderness noted.Bladder is non tender, no masses felt. Rectal: Good sphincter tone, no polyps, or hemorrhoids felt.  Hemoccult negative. Extremities/musculoskeletal:  No swelling or varicosities noted, no clubbing or cyanosis Psych:  No mood changes, alert and cooperative,seems happy PHQ 2 score 0. Examination chaperoned by Estill Bamberg Rash LPN. She requests fasting labs.   Discussed trying to lose 20 lbs and eating more fresh and less processed, and continue to be active.   Impression:  1. Encounter for well woman exam with routine gynecological exam   2. Screening for colorectal cancer   3. Encounter for surveillance of contraceptive pills   4. Elevated cholesterol with elevated triglycerides   5. Screening for diabetes mellitus   6. Encounter for vitamin deficiency screening      Plan: Check CBC,CMP,TSH and lipids,A1c and vitamin D Meds ordered this encounter  Medications  . levonorgestrel-ethinyl estradiol (ENPRESSE-28) tablet    Sig: TAKE 1 TABLET ONCE DAILY.    Dispense:  84 tablet    Refill:  4    Order Specific Question:   Supervising Provider    Answer:   Florian Buff [[id number]]  Physical in 1 year Pap in 2021 Mammogram yearly Colonoscopy per GI

## 2017-12-21 ENCOUNTER — Telehealth: Payer: Self-pay | Admitting: Adult Health

## 2017-12-21 LAB — LIPID PANEL
Chol/HDL Ratio: 5.7 ratio — ABNORMAL HIGH (ref 0.0–4.4)
Cholesterol, Total: 205 mg/dL — ABNORMAL HIGH (ref 100–199)
HDL: 36 mg/dL — ABNORMAL LOW
LDL Calculated: 146 mg/dL — ABNORMAL HIGH (ref 0–99)
Triglycerides: 113 mg/dL (ref 0–149)
VLDL Cholesterol Cal: 23 mg/dL (ref 5–40)

## 2017-12-21 LAB — COMPREHENSIVE METABOLIC PANEL
ALT: 11 IU/L (ref 0–32)
AST: 16 IU/L (ref 0–40)
Albumin/Globulin Ratio: 1.7 (ref 1.2–2.2)
Albumin: 4.2 g/dL (ref 3.5–5.5)
Alkaline Phosphatase: 58 IU/L (ref 39–117)
BUN/Creatinine Ratio: 12 (ref 9–23)
BUN: 11 mg/dL (ref 6–24)
Bilirubin Total: 0.5 mg/dL (ref 0.0–1.2)
CALCIUM: 9.7 mg/dL (ref 8.7–10.2)
CO2: 21 mmol/L (ref 20–29)
Chloride: 102 mmol/L (ref 96–106)
Creatinine, Ser: 0.91 mg/dL (ref 0.57–1.00)
GFR calc Af Amer: 84 mL/min/{1.73_m2} (ref >59)
GFR, EST NON AFRICAN AMERICAN: 73 mL/min/{1.73_m2} (ref >59)
Globulin, Total: 2.5 g/dL (ref 1.5–4.5)
Glucose: 88 mg/dL (ref 65–99)
POTASSIUM: 4.5 mmol/L (ref 3.5–5.2)
Sodium: 139 mmol/L (ref 134–144)
Total Protein: 6.7 g/dL (ref 6.0–8.5)

## 2017-12-21 LAB — CBC
Hematocrit: 43.7 % (ref 34.0–46.6)
Hemoglobin: 14.5 g/dL (ref 11.1–15.9)
MCH: 29.8 pg (ref 26.6–33.0)
MCHC: 33.2 g/dL (ref 31.5–35.7)
MCV: 90 fL (ref 79–97)
Platelets: 291 x10E3/uL (ref 150–450)
RBC: 4.87 x10E6/uL (ref 3.77–5.28)
RDW: 12.2 % — ABNORMAL LOW (ref 12.3–15.4)
WBC: 8 x10E3/uL (ref 3.4–10.8)

## 2017-12-21 LAB — HEMOGLOBIN A1C
Est. average glucose Bld gHb Est-mCnc: 114 mg/dL
Hgb A1c MFr Bld: 5.6 % (ref 4.8–5.6)

## 2017-12-21 LAB — VITAMIN D 25 HYDROXY (VIT D DEFICIENCY, FRACTURES): Vit D, 25-Hydroxy: 24.2 ng/mL — ABNORMAL LOW (ref 30.0–100.0)

## 2017-12-21 LAB — TSH: TSH: 1.6 u[IU]/mL (ref 0.450–4.500)

## 2017-12-21 NOTE — Telephone Encounter (Signed)
Pt aware pf labs, take 2000-4000 IU vitamin D daily, take omega 3 and red yeast rice, watch diet and exercise, try to lose some weight

## 2017-12-22 ENCOUNTER — Other Ambulatory Visit: Payer: Self-pay | Admitting: Adult Health

## 2018-01-05 ENCOUNTER — Other Ambulatory Visit: Payer: Self-pay | Admitting: Obstetrics & Gynecology

## 2018-01-05 DIAGNOSIS — Z1231 Encounter for screening mammogram for malignant neoplasm of breast: Secondary | ICD-10-CM

## 2018-01-11 ENCOUNTER — Ambulatory Visit (HOSPITAL_COMMUNITY)
Admission: RE | Admit: 2018-01-11 | Discharge: 2018-01-11 | Disposition: A | Discharge status: Home / Self Care | Payer: BC Managed Care – PPO | Source: Ambulatory Visit | Attending: Obstetrics & Gynecology | Admitting: Obstetrics & Gynecology

## 2018-01-11 DIAGNOSIS — Z1231 Encounter for screening mammogram for malignant neoplasm of breast: Secondary | ICD-10-CM | POA: Diagnosis present

## 2018-02-21 ENCOUNTER — Other Ambulatory Visit: Payer: Self-pay | Admitting: Adult Health

## 2018-11-20 ENCOUNTER — Encounter: Payer: Self-pay | Admitting: Gastroenterology

## 2018-12-18 ENCOUNTER — Ambulatory Visit (INDEPENDENT_AMBULATORY_CARE_PROVIDER_SITE_OTHER): Payer: Self-pay | Admitting: *Deleted

## 2018-12-18 ENCOUNTER — Other Ambulatory Visit: Payer: Self-pay

## 2018-12-18 DIAGNOSIS — Z86010 Personal history of colonic polyps: Secondary | ICD-10-CM

## 2018-12-18 NOTE — Progress Notes (Addendum)
Gastroenterology Pre-Procedure Review  Request Date: 12/18/2018 Requesting Physician: 3 year recall, Last TCS 12/05/2015 done by Dr. Oneida Alar, tubular adenoma, hyperplastic polyp  PATIENT REVIEW QUESTIONS: The patient responded to the following health history questions as indicated:    1. Diabetes Melitis: no 2. Joint replacements in the past 12 months: no 3. Major health problems in the past 3 months: no 4. Has an artificial valve or MVP: no 5. Has a defibrillator: no 6. Has been advised in past to take antibiotics in advance of a procedure like teeth cleaning: no 7. Family history of colon cancer: yes, mother age: 71  8. Alcohol Use: no 9. Illicit drug Use: no 10. History of sleep apnea: no  11. History of coronary artery or other vascular stents placed within the last 12 months: no 12. History of any prior anesthesia complications: no 13. There is no height or weight on file to calculate BMI.ht: 5'0 wt: 175 lbs    MEDICATIONS & ALLERGIES:    Patient reports the following regarding taking any blood thinners:   Plavix? no Aspirin? no Coumadin? no Brilinta? no Xarelto? no Eliquis? no Pradaxa? no Savaysa? no Effient? no  Patient confirms/reports the following medications:  Current Outpatient Medications  Medication Sig Dispense Refill  . Ascorbic Acid (VITA-C PO) Take by mouth daily.    . cholecalciferol (VITAMIN D) 1000 units tablet Take 1,000 Units by mouth daily.    . Cranberry 1000 MG CAPS Take by mouth.    Ninetta Lights Triphasic (ENPRESSE-28) 50-30/75-40/ 125-30 MCG TABS Take 1 tablet by mouth daily. TAKE 1 TABLET BY MOUTH ONCE DAILY. 28 tablet 12  . RED YEAST RICE EXTRACT PO Take by mouth.     No current facility-administered medications for this visit.     Patient confirms/reports the following allergies:  Allergies  Allergen Reactions  . Penicillins Anaphylaxis  . Zithromax [Azithromycin] Nausea And Vomiting    Z pack  . Sulfa Antibiotics Rash    No  orders of the defined types were placed in this encounter.   AUTHORIZATION INFORMATION Primary Insurance: [place],  [place] #: [id number],  Group #: [id number] Pre-Cert / Josem Kaufmann required: No, not required  SCHEDULE INFORMATION: Procedure has been scheduled as follows:  Date: 04/23/2019, Time: 1:00 Location: [facility] with Dr. Oneida Alar  This Gastroenterology Pre-Precedure Review Form is being routed to the following provider(s): Aliene Altes, PA

## 2018-12-18 NOTE — Progress Notes (Signed)
Pt is aware that our March procedure schedules are not available at this time.  She is aware that I will call her to schedule her if triage is approved and as soon as the schedule becomes available.  Pt voiced understanding.

## 2018-12-19 NOTE — Progress Notes (Signed)
Ok to schedule.

## 2019-01-04 MED ORDER — NA SULFATE-K SULFATE-MG SULF 17.5-3.13-1.6 GM/177ML PO SOLN: 1.0000 | Freq: Once | ORAL | 0 refills | Status: AC

## 2019-01-04 NOTE — Patient Instructions (Addendum)
Ashley Wong   1965/08/15 MRN: [id number]    Procedure Date: 04/23/2019 Time to register: 12:00 pm Place to register: [facility] Procedure Time: 1:00 pm Scheduled provider: Dr. Oneida Alar  PREPARATION FOR COLONOSCOPY WITH TRI-LYTE SPLIT PREP  Please notify us immediately if you are diabetic, take iron supplements, or if you are on Coumadin or any other blood thinners.   Please hold the following medications: n/a  You will need to purchase 1 fleet enema and 1 box of Bisacodyl 73m tablets.   1 DAY BEFORE PROCEDURE:  DATE: 04/22/2019  DAY: Sunday Continue clear liquids the entire day - NO SOLID FOOD.   Diabetic medications adjustments for today: n/a  At 2:00 pm:  Take 2 Bisacodyl tablets.   At 4:00pm:  Start drinking your solution. Make sure you mix well per instructions on the bottle. Try to drink 1 (one) 8 ounce glass every 10-15 minutes until you have consumed HALF the jug. You should complete by 6:00pm.You must keep the left over solution refrigerated until completed next day.  Continue clear liquids. You must drink plenty of clear liquids to prevent dehyration and kidney failure.     DAY OF PROCEDURE:   DATE: 04/23/2019  DAY: Monday If you take medications for your heart, blood pressure or breathing, you may take these medications.  Diabetic medications adjustments for today: n/a  Five hours before your procedure time @ 8:00 am:  Finish remaining amout of bowel prep, drinking 1 (one) 8 ounce glass every 10-15 minutes until complete. You have two hours to consume remaining prep.   Three hours before your procedure time @ 10:00 am:  Nothing by mouth.   At least one hour before going to the hospital:  Give yourself one Fleet enema. You may take your morning medications with sip of water unless we have instructed otherwise.      Please see below for Dietary Information.  CLEAR LIQUIDS INCLUDE:  Water Jello (NOT red in color)   Ice Popsicles (NOT red in  color)   Tea (sugar ok, no milk/cream) Powdered fruit flavored drinks  Coffee (sugar ok, no milk/cream) Gatorade/ Lemonade/ Kool-Aid  (NOT red in color)   Juice: apple, white grape, white cranberry Soft drinks  Clear bullion, consomme, broth (fat free beef/chicken/vegetable)  Carbonated beverages (any kind)  Strained chicken noodle soup Hard Candy   Remember: Clear liquids are liquids that will allow you to see your fingers on the other side of a clear glass. Be sure liquids are NOT red in color, and not cloudy, but CLEAR.  DO NOT EAT OR DRINK ANY OF THE FOLLOWING:  Dairy products of any kind   Cranberry juice Tomato juice / V8 juice   Grapefruit juice Orange juice     Red grape juice  Do not eat any solid foods, including such foods as: cereal, oatmeal, yogurt, fruits, vegetables, creamed soups, eggs, bread, crackers, pureed foods in a blender, etc.   HELPFUL HINTS FOR DRINKING PREP SOLUTION:   Make sure prep is extremely cold. Mix and refrigerate the the morning of the prep. You may also put in the freezer.   You may try mixing some Crystal Light or Country Time Lemonade if you prefer. Mix in small amounts; add more if necessary.  Try drinking through a straw  Rinse mouth with water or a mouthwash between glasses, to remove after-taste.  Try sipping on a cold beverage /ice/ popsicles between glasses of prep.  Place  a piece of sugar-free hard candy in mouth between glasses.  If you become nauseated, try consuming smaller amounts, or stretch out the time between glasses. Stop for 30-60 minutes, then slowly start back drinking.        OTHER INSTRUCTIONS  You will need a responsible adult at least 52 years of age to accompany you and drive you home. This person must remain in the waiting room during your procedure. The hospital will cancel your procedure if you do not have a responsible adult with you.   1. Wear loose fitting clothing that is easily removed. 2. Leave jewelry  and other valuables at home.  3. Remove all body piercing jewelry and leave at home. 4. Total time from sign-in until discharge is approximately 2-3 hours. 5. You should go home directly after your procedure and rest. You can resume normal activities the day after your procedure. 6. The day of your procedure you should not:  Drive  Make legal decisions  Operate machinery  Drink alcohol  Return to work   You may call the office (Dept: [phone]) before 5:00pm, or page the doctor on call [phone]) after 5:00pm, for further instructions, if necessary.   Insurance Information YOU WILL NEED TO CHECK WITH YOUR INSURANCE COMPANY FOR THE BENEFITS OF COVERAGE YOU HAVE FOR THIS PROCEDURE.  UNFORTUNATELY, NOT ALL INSURANCE COMPANIES HAVE BENEFITS TO COVER ALL OR PART OF THESE TYPES OF PROCEDURES.  IT IS YOUR RESPONSIBILITY TO CHECK YOUR BENEFITS, HOWEVER, WE WILL BE GLAD TO ASSIST YOU WITH ANY CODES YOUR INSURANCE COMPANY MAY NEED.    PLEASE NOTE THAT MOST INSURANCE COMPANIES WILL NOT COVER A SCREENING COLONOSCOPY FOR PEOPLE UNDER THE AGE OF 50  IF YOU HAVE BCBS INSURANCE, YOU MAY HAVE BENEFITS FOR A SCREENING COLONOSCOPY BUT IF POLYPS ARE FOUND THE DIAGNOSIS WILL CHANGE AND THEN YOU MAY HAVE A DEDUCTIBLE THAT WILL NEED TO BE MET. SO PLEASE MAKE SURE YOU CHECK YOUR BENEFITS FOR A SCREENING COLONOSCOPY AS WELL AS A DIAGNOSTIC COLONOSCOPY.

## 2019-01-04 NOTE — Addendum Note (Signed)
Addended by: Metro Kung on: 01/04/2019 02:18 PM   Modules accepted: Orders, SmartSet

## 2019-01-04 NOTE — Addendum Note (Signed)
Addended by: Metro Kung on: 01/04/2019 02:07 PM   Modules accepted: Orders

## 2019-01-04 NOTE — Progress Notes (Signed)
Called pt and scheduled her for 04/23/2019 procedure.  Pt is aware that I will mail her out prep instructions.  Routing to [facility] since she signed off on original triage.

## 2019-01-18 ENCOUNTER — Other Ambulatory Visit (HOSPITAL_COMMUNITY): Payer: Self-pay | Admitting: Obstetrics & Gynecology

## 2019-01-18 DIAGNOSIS — Z1231 Encounter for screening mammogram for malignant neoplasm of breast: Secondary | ICD-10-CM

## 2019-01-29 ENCOUNTER — Other Ambulatory Visit: Payer: Self-pay

## 2019-01-29 ENCOUNTER — Ambulatory Visit (HOSPITAL_COMMUNITY)
Admission: RE | Admit: 2019-01-29 | Discharge: 2019-01-29 | Disposition: A | Discharge status: Home / Self Care | Payer: BC Managed Care – PPO | Source: Ambulatory Visit | Attending: Obstetrics & Gynecology | Admitting: Obstetrics & Gynecology

## 2019-01-29 DIAGNOSIS — Z1231 Encounter for screening mammogram for malignant neoplasm of breast: Secondary | ICD-10-CM | POA: Insufficient documentation

## 2019-02-20 ENCOUNTER — Other Ambulatory Visit: Payer: BC Managed Care – PPO | Admitting: Adult Health

## 2019-02-20 ENCOUNTER — Other Ambulatory Visit: Payer: Self-pay | Admitting: Adult Health

## 2019-04-05 ENCOUNTER — Ambulatory Visit (INDEPENDENT_AMBULATORY_CARE_PROVIDER_SITE_OTHER): Payer: BC Managed Care – PPO | Admitting: Adult Health

## 2019-04-05 ENCOUNTER — Encounter: Payer: Self-pay | Admitting: Adult Health

## 2019-04-05 ENCOUNTER — Other Ambulatory Visit (HOSPITAL_COMMUNITY)
Admission: RE | Admit: 2019-04-05 | Discharge: 2019-04-05 | Disposition: A | Discharge status: Home / Self Care | Payer: BC Managed Care – PPO | Source: Ambulatory Visit | Attending: Adult Health | Admitting: Adult Health

## 2019-04-05 ENCOUNTER — Other Ambulatory Visit: Payer: Self-pay

## 2019-04-05 VITALS — BP 130/80 | HR 88 | Ht 60.0 in | Wt 180.5 lb

## 2019-04-05 DIAGNOSIS — Z1211 Encounter for screening for malignant neoplasm of colon: Secondary | ICD-10-CM

## 2019-04-05 DIAGNOSIS — Z1212 Encounter for screening for malignant neoplasm of rectum: Secondary | ICD-10-CM

## 2019-04-05 DIAGNOSIS — Z01419 Encounter for gynecological examination (general) (routine) without abnormal findings: Secondary | ICD-10-CM

## 2019-04-05 DIAGNOSIS — Z3041 Encounter for surveillance of contraceptive pills: Secondary | ICD-10-CM

## 2019-04-05 DIAGNOSIS — E559 Vitamin D deficiency, unspecified: Secondary | ICD-10-CM

## 2019-04-05 DIAGNOSIS — E782 Mixed hyperlipidemia: Secondary | ICD-10-CM

## 2019-04-05 LAB — HEMOCCULT GUIAC POC 1CARD (OFFICE): Fecal Occult Blood, POC: NEGATIVE

## 2019-04-05 MED ORDER — LEVONORG-ETH ESTRAD TRIPHASIC 50-30/75-40/ 125-30 MCG PO TABS: 1.0000 | ORAL_TABLET | Freq: Every day | ORAL | 12 refills | Status: DC

## 2019-04-05 NOTE — Progress Notes (Signed)
Patient ID: Ashley Wong, female   DOB: 05/11/1965, 54 y.o.   MRN: [id number] History of Present Illness: Ashley Wong is a 54 year old white female, married, G1P1, in for a well woman gyn exam and pap. PCP is Dr Woody Seller.   Current Medications, Allergies, Past Medical History, Past Surgical History, Family History and Social History were reviewed in [employer] record.     Review of Systems: Patient denies any headaches, hearing loss, fatigue, blurred vision, shortness of breath, chest pain, abdominal pain, problems with bowel movements, urination, or intercourse. No joint pain or mood swings. She is still having periods on OCs,they are light and short, like 1 day    Physical Exam:BP 130/80 (BP Location: Left Arm, Cuff Size: Normal)   Pulse 88   Ht 5' (1.524 m)   Wt 180 lb 8 oz (81.9 kg)   LMP 03/17/2019   BMI 35.25 kg/m  General:  Well developed, well nourished, no acute distress Skin:  Warm and dry Neck:  Midline trachea, normal thyroid, good ROM, no lymphadenopathy Lungs; Clear to auscultation bilaterally Breast:  No dominant palpable mass, retraction, or nipple discharge Cardiovascular: Regular rate and rhythm Abdomen:  Soft, non tender, no hepatosplenomegaly Pelvic:  External genitalia is normal in appearance, no lesions.  The vagina is normal in appearance. Urethra has no lesions or masses. The cervix is smooth, pap with high risk HPV 16/18 genotyping performed.  Uterus is felt to be normal size, shape, and contour.  No adnexal masses or tenderness noted.Bladder is non tender, no masses felt. Rectal: Good sphincter tone, no polyps, or hemorrhoids felt.  Hemoccult negative. Extremities/musculoskeletal:  No swelling or varicosities noted, no clubbing or cyanosis Psych:  No mood changes, alert and cooperative,seems happy Fall risk is low PHQ 2 score is 0 Examination chaperoned by Diona Fanti CMA She has had 1 COVID vaccine  Impression and Plan: 1. Encounter  for gynecological examination with Papanicolaou smear of cervix Pap sent Physical in 1 year Pap in 3 if normal Mammogram yearly Check CBC,CMP,TSH and lipids  2. Screening for colorectal cancer Colonoscopy 3/22 with Dr Oneida Alar  3. Encounter for surveillance of contraceptive pills Will continue OCs til 28 Meds ordered this encounter  Medications  . Levonorg-Eth Estrad Triphasic (ENPRESSE-28) 50-30/75-40/ 125-30 MCG TABS    Sig: Take 1 tablet by mouth daily.    Dispense:  28 tablet    Refill:  12    Order Specific Question:   Supervising Provider    Answer:   Elonda Husky, LUTHER H [[id number]]    4. Elevated cholesterol with elevated triglycerides Check lipid profile  5. Vitamin D deficiency Check Vitamin D  Continue taking vitamin D, increase to 2000 IU

## 2019-04-06 ENCOUNTER — Telehealth: Payer: Self-pay | Admitting: *Deleted

## 2019-04-06 LAB — CBC
Hematocrit: 44.1 % (ref 34.0–46.6)
Hemoglobin: 15.1 g/dL (ref 11.1–15.9)
MCH: 30.6 pg (ref 26.6–33.0)
MCHC: 34.2 g/dL (ref 31.5–35.7)
MCV: 90 fL (ref 79–97)
Platelets: 288 10*3/uL (ref 150–450)
RBC: 4.93 x10E6/uL (ref 3.77–5.28)
RDW: 12.1 % (ref 11.7–15.4)
WBC: 7.7 10*3/uL (ref 3.4–10.8)

## 2019-04-06 LAB — COMPREHENSIVE METABOLIC PANEL
ALT: 11 IU/L (ref 0–32)
AST: 13 IU/L (ref 0–40)
Albumin/Globulin Ratio: 1.5 (ref 1.2–2.2)
Albumin: 4.3 g/dL (ref 3.8–4.9)
Alkaline Phosphatase: 70 IU/L (ref 39–117)
BUN/Creatinine Ratio: 11 (ref 9–23)
BUN: 11 mg/dL (ref 6–24)
Bilirubin Total: 0.4 mg/dL (ref 0.0–1.2)
CO2: 22 mmol/L (ref 20–29)
Calcium: 9.6 mg/dL (ref 8.7–10.2)
Chloride: 104 mmol/L (ref 96–106)
Creatinine, Ser: 0.99 mg/dL (ref 0.57–1.00)
GFR calc Af Amer: 76 mL/min/{1.73_m2} (ref >59)
GFR calc non Af Amer: 66 mL/min/{1.73_m2} (ref >59)
Globulin, Total: 2.8 g/dL (ref 1.5–4.5)
Glucose: 102 mg/dL — ABNORMAL HIGH (ref 65–99)
Potassium: 3.9 mmol/L (ref 3.5–5.2)
Sodium: 141 mmol/L (ref 134–144)
Total Protein: 7.1 g/dL (ref 6.0–8.5)

## 2019-04-06 LAB — VITAMIN D 25 HYDROXY (VIT D DEFICIENCY, FRACTURES): Vit D, 25-Hydroxy: 25.8 ng/mL — ABNORMAL LOW (ref 30.0–100.0)

## 2019-04-06 LAB — LIPID PANEL
Chol/HDL Ratio: 5.7 ratio — ABNORMAL HIGH (ref 0.0–4.4)
Cholesterol, Total: 204 mg/dL — ABNORMAL HIGH (ref 100–199)
HDL: 36 mg/dL — ABNORMAL LOW (ref >39)
LDL Chol Calc (NIH): 137 mg/dL — ABNORMAL HIGH (ref 0–99)
Triglycerides: 169 mg/dL — ABNORMAL HIGH (ref 0–149)
VLDL Cholesterol Cal: 31 mg/dL (ref 5–40)

## 2019-04-06 LAB — CYTOLOGY - PAP
Adequacy: ABSENT
Comment: NEGATIVE
Diagnosis: NEGATIVE
High risk HPV: NEGATIVE

## 2019-04-06 LAB — TSH: TSH: 1.39 u[IU]/mL (ref 0.450–4.500)

## 2019-04-06 NOTE — Telephone Encounter (Signed)
Pt aware of labs, cholesterol and triglycerides slightly  elevated and vitamin D low, so take vitamin D 2000 IU daily, decrease carbs and continue walking and will recheck lipids in 3 months

## 2019-04-06 NOTE — Telephone Encounter (Signed)
Patient left message that would like blood work results.

## 2019-04-16 ENCOUNTER — Telehealth: Payer: Self-pay | Admitting: Gastroenterology

## 2019-04-16 ENCOUNTER — Other Ambulatory Visit: Payer: Self-pay | Admitting: *Deleted

## 2019-04-16 MED ORDER — PEG 3350-KCL-NA BICARB-NACL 420 G PO SOLR: 4000.0000 mL | Freq: Once | ORAL | 0 refills | Status: AC

## 2019-04-16 NOTE — Telephone Encounter (Signed)
Pt is scheduled procedure with [facility] on 3/22 and her prep is too expensive. Please advise if there's anything cheaper. She uses [facility]. [phone]

## 2019-04-16 NOTE — Telephone Encounter (Signed)
Routing to [facility] 

## 2019-04-16 NOTE — Telephone Encounter (Signed)
Noted  

## 2019-04-16 NOTE — Telephone Encounter (Signed)
Called pt and she is aware that I will send Gol[facility] to [employer] [facility] since [facility] is too expensive.  [facility] is ordering some and says that it should be in tomorrow.  They said that they didn't mind me faxing over the new prep instructions so pt could pick it all up together.  Sent RX electronically in Epic and faxing prep instructions to [phone].  Routing to [facility] as FYI since she originally signed off on triage.

## 2019-04-20 ENCOUNTER — Other Ambulatory Visit: Payer: Self-pay

## 2019-04-20 ENCOUNTER — Other Ambulatory Visit (HOSPITAL_COMMUNITY)
Admission: RE | Admit: 2019-04-20 | Discharge: 2019-04-20 | Disposition: A | Discharge status: Home / Self Care | Payer: BC Managed Care – PPO | Source: Ambulatory Visit | Attending: Gastroenterology | Admitting: Gastroenterology

## 2019-04-20 DIAGNOSIS — Z01812 Encounter for preprocedural laboratory examination: Secondary | ICD-10-CM | POA: Insufficient documentation

## 2019-04-20 DIAGNOSIS — Z20822 Contact with and (suspected) exposure to covid-19: Secondary | ICD-10-CM | POA: Insufficient documentation

## 2019-04-21 LAB — SARS CORONAVIRUS 2 (TAT 6-24 HRS): SARS Coronavirus 2: NEGATIVE

## 2019-04-23 ENCOUNTER — Encounter (HOSPITAL_COMMUNITY)
Admission: RE | Disposition: A | Discharge status: Home / Self Care | Payer: Self-pay | Source: Home / Self Care | Attending: Gastroenterology

## 2019-04-23 ENCOUNTER — Ambulatory Visit (HOSPITAL_COMMUNITY)
Admission: RE | Admit: 2019-04-23 | Discharge: 2019-04-23 | Disposition: A | Discharge status: Home / Self Care | Payer: BC Managed Care – PPO | Attending: Gastroenterology | Admitting: Gastroenterology

## 2019-04-23 ENCOUNTER — Encounter (HOSPITAL_COMMUNITY): Payer: Self-pay | Admitting: Gastroenterology

## 2019-04-23 ENCOUNTER — Other Ambulatory Visit: Payer: Self-pay

## 2019-04-23 DIAGNOSIS — Z800 Family history of malignant neoplasm of digestive organs: Secondary | ICD-10-CM | POA: Diagnosis not present

## 2019-04-23 DIAGNOSIS — D126 Benign neoplasm of colon, unspecified: Secondary | ICD-10-CM

## 2019-04-23 DIAGNOSIS — Z881 Allergy status to other antibiotic agents status: Secondary | ICD-10-CM | POA: Diagnosis not present

## 2019-04-23 DIAGNOSIS — K635 Polyp of colon: Secondary | ICD-10-CM

## 2019-04-23 DIAGNOSIS — K621 Rectal polyp: Secondary | ICD-10-CM | POA: Diagnosis not present

## 2019-04-23 DIAGNOSIS — Z8249 Family history of ischemic heart disease and other diseases of the circulatory system: Secondary | ICD-10-CM | POA: Insufficient documentation

## 2019-04-23 DIAGNOSIS — D122 Benign neoplasm of ascending colon: Secondary | ICD-10-CM | POA: Diagnosis not present

## 2019-04-23 DIAGNOSIS — Z1211 Encounter for screening for malignant neoplasm of colon: Secondary | ICD-10-CM | POA: Insufficient documentation

## 2019-04-23 DIAGNOSIS — Z86010 Personal history of colonic polyps: Secondary | ICD-10-CM | POA: Insufficient documentation

## 2019-04-23 DIAGNOSIS — D120 Benign neoplasm of cecum: Secondary | ICD-10-CM | POA: Insufficient documentation

## 2019-04-23 DIAGNOSIS — Z791 Long term (current) use of non-steroidal anti-inflammatories (NSAID): Secondary | ICD-10-CM | POA: Insufficient documentation

## 2019-04-23 DIAGNOSIS — K644 Residual hemorrhoidal skin tags: Secondary | ICD-10-CM | POA: Diagnosis not present

## 2019-04-23 DIAGNOSIS — Q438 Other specified congenital malformations of intestine: Secondary | ICD-10-CM | POA: Insufficient documentation

## 2019-04-23 DIAGNOSIS — D123 Benign neoplasm of transverse colon: Secondary | ICD-10-CM | POA: Diagnosis not present

## 2019-04-23 DIAGNOSIS — Z882 Allergy status to sulfonamides status: Secondary | ICD-10-CM | POA: Insufficient documentation

## 2019-04-23 DIAGNOSIS — Z880 Allergy status to penicillin: Secondary | ICD-10-CM | POA: Diagnosis not present

## 2019-04-23 DIAGNOSIS — K648 Other hemorrhoids: Secondary | ICD-10-CM | POA: Diagnosis not present

## 2019-04-23 HISTORY — PX: COLONOSCOPY: SHX5424

## 2019-04-23 HISTORY — PX: POLYPECTOMY: SHX5525

## 2019-04-23 SURGERY — COLONOSCOPY
Anesthesia: Moderate Sedation

## 2019-04-23 MED ORDER — STERILE WATER FOR IRRIGATION IR SOLN
Status: DC | PRN
  Administered 2019-04-23: 1.5 mL

## 2019-04-23 MED ORDER — MEPERIDINE HCL 100 MG/ML IJ SOLN
INTRAMUSCULAR | Status: DC | PRN
  Administered 2019-04-23: 50 mg via INTRAVENOUS
  Administered 2019-04-23 (×2): 25 mg via INTRAVENOUS

## 2019-04-23 MED ORDER — MIDAZOLAM HCL 5 MG/5ML IJ SOLN
INTRAMUSCULAR | Status: AC
  Filled 2019-04-23: qty 10

## 2019-04-23 MED ORDER — SODIUM CHLORIDE 0.9 % IV SOLN: INTRAVENOUS | Status: DC

## 2019-04-23 MED ORDER — MEPERIDINE HCL 100 MG/ML IJ SOLN
INTRAMUSCULAR | Status: AC
  Filled 2019-04-23: qty 2

## 2019-04-23 MED ORDER — MIDAZOLAM HCL 5 MG/5ML IJ SOLN
INTRAMUSCULAR | Status: DC | PRN
  Administered 2019-04-23 (×3): 2 mg via INTRAVENOUS

## 2019-04-23 NOTE — H&P (Signed)
Primary Care Physician:  Glenda Chroman, MD Primary Gastroenterologist:  Dr. Oneida Alar  Pre-Procedure History & Physical: HPI:  Ashley Wong is a 54 y.o. female here for  PERSONAL HISTORY OF POLYPS: simple adenomas in 2017.  Past Medical History:  Diagnosis Date  . Contraceptive management 11/19/2013  . Fecal occult blood test positive 10/13/2015   Will sent 3 cards home  . Hemorrhoids   . Urinary frequency 12/18/2013    Past Surgical History:  Procedure Laterality Date  . CESAREAN SECTION  2004  . COLONOSCOPY N/A 12/05/2015   Procedure: COLONOSCOPY;  Surgeon: Danie Binder, MD;  Location: [facility];  Service: Endoscopy;  Laterality: N/A;  245  . POLYPECTOMY  12/05/2015   Procedure: POLYPECTOMY;  Surgeon: Danie Binder, MD;  Location: [facility];  Service: Endoscopy;;  colon    Prior to Admission medications   Medication Sig Start Date End Date Taking? Authorizing Provider  Cholecalciferol (VITAMIN D3) 10 MCG (400 UNIT) CAPS Take 2,000 Units by mouth daily after supper.   Yes [provider]  ibuprofen (ADVIL) 200 MG tablet Take 400 mg by mouth every 8 (eight) hours as needed for headache or moderate pain.   Yes [provider]  Ninetta Lights Triphasic (ENPRESSE-28) 50-30/75-40/ 125-30 MCG TABS Take 1 tablet by mouth daily. Patient taking differently: Take 1 tablet by mouth every evening.  04/05/19  Yes Derrek Monaco A, NP  RED YEAST RICE EXTRACT PO Take 2 capsules by mouth every evening.    Yes [provider]    Allergies as of 01/04/2019 - Review Complete 12/18/2018  Allergen Reaction Noted  . Penicillins Anaphylaxis 11/19/2013  . Zithromax [azithromycin] Nausea And Vomiting 11/19/2013  . Sulfa antibiotics Rash 11/19/2013    Family History  Problem Relation Age of Onset  . Heart disease Mother   . Colon cancer Mother 57       s/p surgery, no adjuvent therapy    Social History   Socioeconomic History  . Marital status:  Married    Spouse name: Not on file  . Number of children: Not on file  . Years of education: Not on file  . Highest education level: Not on file  Occupational History  . Not on file  Tobacco Use  . Smoking status: Never Smoker  . Smokeless tobacco: Never Used  Substance and Sexual Activity  . Alcohol use: No  . Drug use: No  . Sexual activity: Yes    Birth control/protection: Pill  Other Topics Concern  . Not on file  Social History Narrative   Teacher   Social Determinants of Health   Financial Resource Strain:   . Difficulty of Paying Living Expenses:   Food Insecurity:   . Worried About Charity fundraiser in the Last Year:   . Arboriculturist in the Last Year:   Transportation Needs:   . Film/video editor (Medical):   Marland Kitchen Lack of Transportation (Non-Medical):   Physical Activity:   . Days of Exercise per Week:   . Minutes of Exercise per Session:   Stress:   . Feeling of Stress :   Social Connections:   . Frequency of Communication with Friends and Family:   . Frequency of Social Gatherings with Friends and Family:   . Attends Religious Services:   . Active Member of Clubs or Organizations:   . Attends Archivist Meetings:   Marland Kitchen Marital Status:   Intimate Partner Violence:   .  Fear of Current or Ex-Partner:   . Emotionally Abused:   Marland Kitchen Physically Abused:   . Sexually Abused:     Review of Systems: See HPI, otherwise negative ROS   Physical Exam: BP 136/78   Pulse (!) 113   Temp 98.2 F (36.8 C) (Oral)   Resp 20   Ht 5' (1.524 m)   Wt 79.4 kg   LMP 04/14/2019 (Exact Date)   SpO2 100%   BMI 34.18 kg/m  General:   Alert,  pleasant and cooperative in NAD Head:  Normocephalic and atraumatic. Neck:  Supple; Lungs:  Clear throughout to auscultation.    Heart:  Regular rate and rhythm. Abdomen:  Soft, nontender and nondistended. Normal bowel sounds, without guarding, and without rebound.   Neurologic:  Alert and  oriented x4;  grossly  normal neurologically.  Impression/Plan:      PERSONAL HISTORY OF POLYPS.  PLAN: 1. TCS TODAY. DISCUSSED PROCEDURE, BENEFITS, & RISKS: < 1% chance of medication reaction, bleeding, perforation, ASPIRATION, or rupture of spleen/liver requiring surgery to fix it and missed polyps < 1 cm 10-20% of the time.

## 2019-04-23 NOTE — Op Note (Signed)
[facility] Patient Name: Ashley Wong Procedure Date: 04/23/2019 1:00 PM MRN: [id number] Date of Birth: Mar 12, 1965 Attending MD: Barney Drain MD, MD CSN: [id number] Age: 54 Admit Type: Outpatient Procedure:                Colonoscopy WITH COLD SNARE POLYPECTOMY Indications:              Personal history of colonic polyps Providers:                Barney Drain MD, MD, Otis Peak B. Sharon Seller, RN, Nelma Rothman, Technician Referring MD:             Glenda Chroman Medicines:                Meperidine 100 mg IV, Midazolam 6 mg IV Complications:            No immediate complications. Estimated Blood Loss:     Estimated blood loss was minimal. Procedure:                Pre-Anesthesia Assessment:                           - Prior to the procedure, a History and Physical                            was performed, and patient medications and                            allergies were reviewed. The patient's tolerance of                            previous anesthesia was also reviewed. The risks                            and benefits of the procedure and the sedation                            options and risks were discussed with the patient.                            All questions were answered, and informed consent                            was obtained. Prior Anticoagulants: The patient has                            taken no previous anticoagulant or antiplatelet                            agents except for NSAID medication. ASA Grade                            Assessment: II - A patient with mild systemic  disease. After reviewing the risks and benefits,                            the patient was deemed in satisfactory condition to                            undergo the procedure. After obtaining informed                            consent, the colonoscope was passed under direct                            vision. Throughout the  procedure, the patient's                            blood pressure, pulse, and oxygen saturations were                            monitored continuously. The PCF-[id number]                            scope was introduced through the anus and advanced                            to the the cecum, identified by appendiceal orifice                            and ileocecal valve. The colonoscopy was somewhat                            difficult due to a tortuous colon. Successful                            completion of the procedure was aided by                            straightening and shortening the scope to obtain                            bowel loop reduction and COLOWRAP. The patient                            tolerated the procedure well. The quality of the                            bowel preparation was excellent. The ileocecal                            valve, appendiceal orifice, and rectum were                            photographed. Scope In: 1:33:34 PM Scope Out: 1:54:51 PM Scope Withdrawal Time: 0 hours 19 minutes 0 seconds  Total Procedure Duration: 0  hours 21 minutes 17 seconds  Findings:      Eight sessile polyps were found in the rectum(2), recto-sigmoid colon,       splenic flexure, hepatic flexure, ascending colon(2) and cecum(2). The       polyps were 2 to 8 mm in size. These polyps were removed with a cold       snare. Resection and retrieval were complete.      External and internal hemorrhoids were found.      The recto-sigmoid colon and sigmoid colon were mildly tortuous. Impression:               - Eight 2 to 8 mm polyps at the                            retum(2)/recto-sigmoid colon, at the splenic                            flexure(BTL2), at the hepatic flexure, in the                            ascending colon and in the cecum, removed with a                            cold snare. Resected and retrieved.                           - External and  internal hemorrhoids.                           - Tortuous colon. Moderate Sedation:      Moderate (conscious) sedation was administered by the endoscopy nurse       and supervised by the endoscopist. The following parameters were       monitored: oxygen saturation, heart rate, blood pressure, and response       to care. Total physician intraservice time was 33 minutes. Recommendation:           - Patient has a contact number available for                            emergencies. The signs and symptoms of potential                            delayed complications were discussed with the                            patient. Return to normal activities tomorrow.                            Written discharge instructions were provided to the                            patient.                           - High fiber diet.                           -  Continue present medications.                           - Await pathology results.                           - Repeat colonoscopy in 3 years for surveillance. Procedure Code(s):        --- Professional ---                           [phone], Colonoscopy, flexible; with removal of                            tumor(s), polyp(s), or other lesion(s) by snare                            technique                           99153, Moderate sedation; each additional 15                            minutes intraservice time                           G0500, Moderate sedation services provided by the                            same physician or other qualified health care                            professional performing a gastrointestinal                            endoscopic service that sedation supports,                            requiring the presence of an independent trained                            observer to assist in the monitoring of the                            patient's level of consciousness and physiological                            status;  initial 15 minutes of intra-service time;                            patient age 75 years or older (additional time may                            be reported with [phone], as appropriate) Diagnosis Code(s):        --- Professional ---  K63.5, Polyp of colon                           K64.8, Other hemorrhoids                           Z86.010, Personal history of colonic polyps                           Q43.8, Other specified congenital malformations of                            intestine CPT copyright 2019 American Medical Association. All rights reserved. The codes documented in this report are preliminary and upon coder review may  be revised to meet current compliance requirements. Barney Drain, MD Barney Drain MD, MD 04/23/2019 2:10:11 PM This report has been signed electronically. Number of Addenda: 0

## 2019-04-23 NOTE — Discharge Instructions (Signed)
You had 8 polyps removed. You have internal hemorrhoids.  EAT TO LIVE AND THINK OF FOOD AS MEDICINE. 75% OF YOUR PLATE SHOULD BE FRUITS/VEGGIES.  To have more energy, and to lose weight:      1. CONTINUE YOUR WEIGHT LOSS EFFORTS. I RECOMMEND YOU READ AND FOLLOW RECOMMENDATIONS BY DR. MARK HYMAN, "10-DAY DETOX DIET". YOUR BODY MASS INDEX IS OVER 30 WHICH MEANS YOU ARE OBESE. OBESITY IS ASSOCIATED WITH AN INCREASED FOR POLYPS, CIRRHOSIS AND ALL CANCERS, INCLUDING ESOPHAGEAL AND COLON CANCER.     2. If you must eat bread, EAT EZEKIEL BREAD. IT IS IN THE FROZEN SECTION OF THE GROCERY STORE.    3. DRINK WATER WITH FRUIT OR CUCUMBER ADDED. YOUR URINE SHOULD BE LIGHT YELLOW. AVOID SODA, GATORADE, ENERGY DRINKS, OR DIET SODA.     4. AVOID HIGH FRUCTOSE CORN SYRUP AND CAFFEINE.     5. DO NOT chew SUGAR FREE GUM OR USE ARTIFICIAL SWEETENERS. IF NEEDED USE STEVIA AS A SWEETENER.    6. DO NOT EAT ENRICHED WHEAT FLOUR, PASTA, RICE, OR CEREAL.    7. ONLY EAT WILD CAUGHT SEAFOOD, GRASS FED BEEF OR CHICKEN, PORK FROM PASTURE RAISE PIGS, OR EGGS FROM PASTURE RAISED CHICKENS.    8. PRACTICE CHAIR YOGA FOR 15-30 MINS 3 OR 4 TIMES A WEEK AND PROGRESS TO HATHA YOGA OVER NEXT 6 MOS.    9. START TAKING A MULTIVITAMIN, AND VITAMIN B12 DAILY.   10.  CONTINUE  VITAMIN D3 2000 IU DAILY.   FOR 3 MOS TAKE THESE ADDITIONAL SUPPLEMENTS TO DECREASE CRAVING AND SUPPRESS YOUR APPETITE:    1. CINNAMON 500 MG EVERY AM PRIOR TO FIRST MEAL.   **STABILIZES BLOOD GLUCOSE/REDUCES CRAVINGS**    2. CHROMIUM 400-500 MG WITH MEALS TWICE DAILY.    **FAT BURNER**    3. GREEN TEA EXTRACT ONE DAILY WITH A MEAL.   **FAT BURNER/SUPPRESSES YOUR APPETITE**    4. ALPHA LIPOIC ACID TWICE DAILY WITH A MEAL.   **NATURAL ANTI-INFLAMMATORY SUPPLEMENT THAT IS AN ALTERNATIVE TO IBUPROFEN OR NAPROXEN**   Please CALL or SEND me A MY CHART MESSAGE IF YOU HAVE QUESTIONS OR CONCERNS.  YOUR BIOPSY RESULTS WILL BE BACK IN 5 BUSINESS DAYS.     Next colonoscopy in 3 years.  YOUR SISTERS, BROTHERS, CHILDREN, AND PARENTS NEED TO HAVE A COLONOSCOPY STARTING AT THE AGE OF 45.    Colonoscopy Care After Read the instructions outlined below and refer to this sheet in the next week. These discharge instructions provide you with general information on caring for yourself after you leave the hospital. While your treatment has been planned according to the most current medical practices available, unavoidable complications occasionally occur. If you have any problems or questions after discharge, call DR. Kaleigh Spiegelman, [phone].  ACTIVITY  You may resume your regular activity, but move at a slower pace for the next 24 hours.   Take frequent rest periods for the next 24 hours.   Walking will help get rid of the air and reduce the bloated feeling in your belly (abdomen).   No driving for 24 hours (because of the medicine (anesthesia) used during the test).   You may shower.   Do not sign any important legal documents or operate any machinery for 24 hours (because of the anesthesia used during the test).    NUTRITION  Drink plenty of fluids.   You may resume your normal diet as instructed by your doctor.   Begin with a light meal and  progress to your normal diet. Heavy or fried foods are harder to digest and may make you feel sick to your stomach (nauseated).   Avoid alcoholic beverages for 24 hours or as instructed.    MEDICATIONS  You may resume your normal medications.   WHAT YOU CAN EXPECT TODAY  Some feelings of bloating in the abdomen.   Passage of more gas than usual.   Spotting of blood in your stool or on the toilet paper  .  IF YOU HAD POLYPS REMOVED DURING THE COLONOSCOPY:  Eat a soft diet IF YOU HAVE NAUSEA, BLOATING, ABDOMINAL PAIN, OR VOMITING.    FINDING OUT THE RESULTS OF YOUR TEST Not all test results are available during your visit. DR. Oneida Alar WILL CALL YOU WITHIN 14 DAYS OF YOUR PROCEDUE WITH YOUR  RESULTS. Do not assume everything is normal if you have not heard from DR. Laquanna Veazey, CALL HER OFFICE AT [phone].  SEEK IMMEDIATE MEDICAL ATTENTION AND CALL THE OFFICE: [phone] IF:  You have more than a spotting of blood in your stool.   Your belly is swollen (abdominal distention).   You are nauseated or vomiting.   You have a temperature over 101F.   You have abdominal pain or discomfort that is severe or gets worse throughout the day.   High-Fiber Diet A high-fiber diet changes your normal diet to include more whole grains, legumes, fruits, and vegetables. Changes in the diet involve replacing refined carbohydrates with unrefined foods. The calorie level of the diet is essentially unchanged. The Dietary Reference Intake (recommended amount) for adult males is 38 grams per day. For adult females, it is 25 grams per day. Pregnant and lactating women should consume 28 grams of fiber per day. Fiber is the intact part of a plant that is not broken down during digestion. Functional fiber is fiber that has been isolated from the plant to provide a beneficial effect in the body.  PURPOSE Increase stool bulk.  Ease and regulate bowel movements.  Lower cholesterol.  REDUCE RISK OF COLON CANCER  INDICATIONS THAT YOU NEED MORE FIBER Constipation and hemorrhoids.  Uncomplicated diverticulosis (intestine condition) and irritable bowel syndrome.  Weight management.  As a protective measure against hardening of the arteries (atherosclerosis), diabetes, and cancer.   GUIDELINES FOR INCREASING FIBER IN THE DIET Start adding fiber to the diet slowly. A gradual increase of about 5 more grams (2 servings of most fruits or vegetables) per day is best. Too rapid an increase in fiber may result in constipation, flatulence, and bloating.  Drink enough water and fluids to keep your urine clear or pale yellow. Water, juice, or caffeine-free drinks are recommended. Not drinking enough fluid may cause  constipation.  Eat a variety of high-fiber foods rather than one type of fiber.  Try to increase your intake of fiber through using high-fiber foods rather than fiber pills or supplements that contain small amounts of fiber.  The goal is to change the types of food eaten. Do not supplement your present diet with high-fiber foods, but replace foods in your present diet.    Polyps, Colon  A polyp is extra tissue that grows inside your body. Colon polyps grow in the large intestine. The large intestine, also called the colon, is part of your digestive system. It is a long, hollow tube at the end of your digestive tract where your body makes and stores stool. Most polyps are not dangerous. They are benign. This means they are not cancerous. But over  time, some types of polyps can turn into cancer. Polyps that are smaller than a pea are usually not harmful. But larger polyps could someday become or may already be cancerous. To be safe, doctors remove all polyps and test them.   WHO GETS POLYPS? Anyone can get polyps, but certain people are more likely than others. You may have a greater chance of getting polyps if:  You are over 50.   You have had polyps before.   Someone in your family has had polyps.   Someone in your family has had cancer of the large intestine.   Find out if someone in your family has had polyps. You may also be more likely to get polyps if you:   Eat a lot of fatty foods   Smoke   Drink alcohol   Do not exercise  Eat too much   PREVENTION There is not one sure way to prevent polyps. You might be able to lower your risk of getting them if you:  Eat more fruits and vegetables and less fatty food.   Do not smoke.   Avoid alcohol.   Exercise every day.   Lose weight if you are overweight.   Eating more calcium and folate can also lower your risk of getting polyps. Some foods that are rich in calcium are milk, cheese, and broccoli. Some foods that are rich in  folate are chickpeas, kidney beans, and spinach.

## 2019-04-25 LAB — SURGICAL PATHOLOGY

## 2019-05-22 ENCOUNTER — Ambulatory Visit: Payer: BC Managed Care – PPO | Admitting: Adult Health

## 2019-05-22 ENCOUNTER — Encounter: Payer: Self-pay | Admitting: Adult Health

## 2019-05-22 ENCOUNTER — Other Ambulatory Visit: Payer: Self-pay

## 2019-05-22 VITALS — BP 132/93 | HR 92 | Ht 60.0 in | Wt 173.0 lb

## 2019-05-22 DIAGNOSIS — N390 Urinary tract infection, site not specified: Secondary | ICD-10-CM

## 2019-05-22 DIAGNOSIS — R350 Frequency of micturition: Secondary | ICD-10-CM | POA: Diagnosis not present

## 2019-05-22 LAB — POCT URINALYSIS DIPSTICK
Blood, UA: NEGATIVE
Glucose, UA: NEGATIVE
Ketones, UA: NEGATIVE
Nitrite, UA: NEGATIVE
Protein, UA: POSITIVE — AB

## 2019-05-22 MED ORDER — PHENAZOPYRIDINE HCL 200 MG PO TABS: 200.0000 mg | ORAL_TABLET | Freq: Three times a day (TID) | ORAL | 0 refills | Status: DC | PRN

## 2019-05-22 MED ORDER — NITROFURANTOIN MONOHYD MACRO 100 MG PO CAPS: 100.0000 mg | ORAL_CAPSULE | Freq: Two times a day (BID) | ORAL | 0 refills | Status: DC

## 2019-05-22 NOTE — Progress Notes (Signed)
  Subjective:     Patient ID: Ashley Wong, female   DOB: 1965-03-06, 54 y.o.   MRN: [id number]  HPI Ashley Wong is a 54 year old white female,married, G1P1 in complaining of urinary frequency and burning with nocturia since Friday, has started drinking cranberry juice.  PCP is Dr Woody Seller  Review of Systems Has urinary frequency  +nocturia  +burning at end of stream  Reviewed past medical,surgical, social and family history. Reviewed medications and allergies.      Objective:   Physical Exam BP (!) 132/93 (BP Location: Left Arm, Patient Position: Sitting, Cuff Size: Normal)   Pulse 92   Ht 5' (1.524 m)   Wt 173 lb (78.5 kg)   LMP 05/13/2019   BMI 33.79 kg/m urine dipstick trace protein and leuks,  Skin warm and dry, no CVAT    Assessment:     1. Urinary frequency   2. Urinary tract infection without hematuria, site unspecified UA C&S sent Will Rx Macrobid and pyridium Meds ordered this encounter  Medications  . nitrofurantoin, macrocrystal-monohydrate, (MACROBID) 100 MG capsule    Sig: Take 1 capsule (100 mg total) by mouth 2 (two) times daily.    Dispense:  14 capsule    Refill:  0    Order Specific Question:   Supervising Provider    Answer:   Elonda Husky, LUTHER H [[id number]]  . phenazopyridine (PYRIDIUM) 200 MG tablet    Sig: Take 1 tablet (200 mg total) by mouth 3 (three) times daily as needed for pain.    Dispense:  10 tablet    Refill:  0    Order Specific Question:   Supervising Provider    Answer:   Tania Ade H [[id number]]  Increase water  No sex during treatment     Plan:     Follow up prn

## 2019-05-23 LAB — URINALYSIS, ROUTINE W REFLEX MICROSCOPIC
Bilirubin, UA: NEGATIVE
Glucose, UA: NEGATIVE
Nitrite, UA: NEGATIVE
Specific Gravity, UA: 1.022 (ref 1.005–1.030)
Urobilinogen, Ur: 0.2 mg/dL (ref 0.2–1.0)
pH, UA: 5.5 (ref 5.0–7.5)

## 2019-05-23 LAB — MICROSCOPIC EXAMINATION
Casts: NONE SEEN /lpf
Epithelial Cells (non renal): >10 /hpf — AB (ref 0–10)
WBC, UA: >30 /hpf — AB (ref 0–5)

## 2019-05-25 LAB — URINE CULTURE

## 2019-06-08 ENCOUNTER — Other Ambulatory Visit: Payer: Self-pay | Admitting: Adult Health

## 2019-08-09 ENCOUNTER — Ambulatory Visit: Payer: BC Managed Care – PPO | Admitting: Adult Health

## 2019-08-09 ENCOUNTER — Encounter: Payer: Self-pay | Admitting: Adult Health

## 2019-08-09 VITALS — BP 116/83 | HR 94 | Temp 98.5°F | Ht 60.0 in | Wt 167.4 lb

## 2019-08-09 DIAGNOSIS — R319 Hematuria, unspecified: Secondary | ICD-10-CM | POA: Insufficient documentation

## 2019-08-09 DIAGNOSIS — R350 Frequency of micturition: Secondary | ICD-10-CM

## 2019-08-09 DIAGNOSIS — N390 Urinary tract infection, site not specified: Secondary | ICD-10-CM

## 2019-08-09 LAB — POCT URINALYSIS DIPSTICK OB
Glucose, UA: NEGATIVE
Ketones, UA: NEGATIVE
Nitrite, UA: NEGATIVE
POC,PROTEIN,UA: NEGATIVE

## 2019-08-09 MED ORDER — PHENAZOPYRIDINE HCL 200 MG PO TABS: 200.0000 mg | ORAL_TABLET | Freq: Three times a day (TID) | ORAL | 0 refills | Status: DC | PRN

## 2019-08-09 MED ORDER — NITROFURANTOIN MONOHYD MACRO 100 MG PO CAPS: 100.0000 mg | ORAL_CAPSULE | Freq: Two times a day (BID) | ORAL | 0 refills | Status: DC

## 2019-08-09 NOTE — Progress Notes (Signed)
  Subjective:     Patient ID: Ashley Wong, female   DOB: [birth year], 54 y.o.   MRN: [id number]  HPI Ashley Wong is a 54 year old white female,married, G1P1 in complaining of UTI symptoms with urinary frequency and nocturia. PCP is Dr Woody Seller   Review of Systems  urinary frequency and nocturia  Reviewed past medical,surgical, social and family history. Reviewed medications and allergies.     Objective:   Physical Exam BP 116/83 (BP Location: Right Arm, Patient Position: Sitting, Cuff Size: Normal)   Pulse 94   Temp 98.5 F (36.9 C)   Ht 5' (1.524 m)   Wt 167 lb 6.4 oz (75.9 kg)   LMP 08/02/2019   BMI 32.69 kg/m urine dipstick was +blood and 1+ leuks   Skin warm and dry. Lungs: clear to ausculation bilaterally. Cardiovascular: regular rate and rhythm. Bladder non tender, no CVAT  Assessment:     1. Frequency of urination UA C&S sent   2. Urinary tract infection with hematuria, site unspecified Will rx Macrobid and pyridium Push fluids  Meds ordered this encounter  Medications  . nitrofurantoin, macrocrystal-monohydrate, (MACROBID) 100 MG capsule    Sig: Take 1 capsule (100 mg total) by mouth 2 (two) times daily.    Dispense:  14 capsule    Refill:  0    Order Specific Question:   Supervising Provider    Answer:   Elonda Husky, LUTHER H [[id number]]  . phenazopyridine (PYRIDIUM) 200 MG tablet    Sig: Take 1 tablet (200 mg total) by mouth 3 (three) times daily as needed for pain.    Dispense:  10 tablet    Refill:  0    Order Specific Question:   Supervising Provider    Answer:   Florian Buff [[id number]]      Plan:     Follow up prn

## 2019-08-10 LAB — MICROSCOPIC EXAMINATION
Casts: NONE SEEN /lpf
RBC, Urine: NONE SEEN /hpf (ref 0–2)
WBC, UA: >30 /hpf — AB (ref 0–5)

## 2019-08-10 LAB — URINALYSIS, ROUTINE W REFLEX MICROSCOPIC
Bilirubin, UA: NEGATIVE
Glucose, UA: NEGATIVE
Ketones, UA: NEGATIVE
Nitrite, UA: NEGATIVE
Protein,UA: NEGATIVE
RBC, UA: NEGATIVE
Specific Gravity, UA: 1.02 (ref 1.005–1.030)
Urobilinogen, Ur: 0.2 mg/dL (ref 0.2–1.0)
pH, UA: 6.5 (ref 5.0–7.5)

## 2019-08-12 LAB — URINE CULTURE

## 2020-03-05 ENCOUNTER — Encounter (HOSPITAL_COMMUNITY): Payer: BC Managed Care – PPO

## 2020-03-05 DIAGNOSIS — Z1231 Encounter for screening mammogram for malignant neoplasm of breast: Secondary | ICD-10-CM

## 2020-03-10 ENCOUNTER — Ambulatory Visit (HOSPITAL_COMMUNITY): Admission: RE | Admit: 2020-03-10 | Payer: Self-pay | Source: Ambulatory Visit

## 2020-03-11 ENCOUNTER — Other Ambulatory Visit (HOSPITAL_COMMUNITY): Payer: Self-pay | Admitting: Obstetrics & Gynecology

## 2020-03-11 DIAGNOSIS — Z1231 Encounter for screening mammogram for malignant neoplasm of breast: Secondary | ICD-10-CM

## 2020-03-12 ENCOUNTER — Ambulatory Visit (HOSPITAL_COMMUNITY)
Admission: RE | Admit: 2020-03-12 | Discharge: 2020-03-12 | Disposition: A | Discharge status: Home / Self Care | Payer: BC Managed Care – PPO | Source: Ambulatory Visit | Attending: Obstetrics & Gynecology | Admitting: Obstetrics & Gynecology

## 2020-03-12 ENCOUNTER — Other Ambulatory Visit: Payer: Self-pay

## 2020-03-12 DIAGNOSIS — Z1231 Encounter for screening mammogram for malignant neoplasm of breast: Secondary | ICD-10-CM | POA: Insufficient documentation

## 2020-05-01 ENCOUNTER — Other Ambulatory Visit: Payer: Self-pay | Admitting: Adult Health

## 2020-05-07 ENCOUNTER — Other Ambulatory Visit: Payer: Self-pay

## 2020-05-07 ENCOUNTER — Encounter: Payer: Self-pay | Admitting: Dermatology

## 2020-05-07 ENCOUNTER — Ambulatory Visit: Payer: BC Managed Care – PPO | Admitting: Dermatology

## 2020-05-07 DIAGNOSIS — D1801 Hemangioma of skin and subcutaneous tissue: Secondary | ICD-10-CM | POA: Diagnosis not present

## 2020-05-07 DIAGNOSIS — L918 Other hypertrophic disorders of the skin: Secondary | ICD-10-CM

## 2020-05-07 DIAGNOSIS — L821 Other seborrheic keratosis: Secondary | ICD-10-CM | POA: Diagnosis not present

## 2020-05-07 DIAGNOSIS — Z1283 Encounter for screening for malignant neoplasm of skin: Secondary | ICD-10-CM

## 2020-05-19 ENCOUNTER — Encounter: Payer: Self-pay | Admitting: Dermatology

## 2020-05-19 NOTE — Progress Notes (Signed)
   New Patient   Subjective  Ashley Wong is a 55 y.o. female who presents for the following: Annual Exam (Here for full body skin examination- no history of melanoma or non mole skin cancer. Patient denies any family h/o skin cancer.).  General skin examination Location:  Duration:  Quality:  Associated Signs/Symptoms: Modifying Factors:  Severity:  Timing: Context:    The following portions of the chart were reviewed this encounter and updated as appropriate:  Tobacco  Allergies  Meds  Problems  Med Hx  Surg Hx  Fam Hx      Objective  Well appearing patient in no apparent distress; mood and affect are within normal limits. Objective  Scalp: Full body skin check. No atypical moles, no skin cancer.     Objective  Left Lower Back: Back, left arm, both legs, under left arm: Flattopped brown textured 4 to 6 mm papules  Objective  Neck - Anterior: Multiple 1 to 3 mm pedunculated flesh-colored papules  Objective  Right Frontal Scalp: Multiple 1 to 3 mm smooth red papules    A full examination was performed including scalp, head, eyes, ears, nose, lips, neck, chest, axillae, abdomen, back, buttocks, bilateral upper extremities, bilateral lower extremities, hands, feet, fingers, toes, fingernails, and toenails. All findings within normal limits unless otherwise noted below.  Areas beneath undergarments not fully examined.   Assessment & Plan  Screening exam for skin cancer Scalp  Yearly skin check. a patient encouraged to self examine with spouse twice annually.  Continued ultraviolet protection.  Seborrheic keratosis Left Lower Back  Leave if stable  Skin tag Neck - Anterior  Patient may choose to have removed in future.  Cherry angioma Right Frontal Scalp  Intervention not necessary

## 2020-05-21 ENCOUNTER — Encounter: Payer: Self-pay | Admitting: Adult Health

## 2020-05-21 ENCOUNTER — Ambulatory Visit (INDEPENDENT_AMBULATORY_CARE_PROVIDER_SITE_OTHER): Payer: BC Managed Care – PPO | Admitting: Adult Health

## 2020-05-21 ENCOUNTER — Other Ambulatory Visit: Payer: Self-pay

## 2020-05-21 VITALS — BP 122/83 | HR 85 | Ht 60.0 in | Wt 161.2 lb

## 2020-05-21 DIAGNOSIS — E559 Vitamin D deficiency, unspecified: Secondary | ICD-10-CM

## 2020-05-21 DIAGNOSIS — Z01419 Encounter for gynecological examination (general) (routine) without abnormal findings: Secondary | ICD-10-CM | POA: Diagnosis not present

## 2020-05-21 DIAGNOSIS — Z1211 Encounter for screening for malignant neoplasm of colon: Secondary | ICD-10-CM | POA: Diagnosis not present

## 2020-05-21 DIAGNOSIS — Z3041 Encounter for surveillance of contraceptive pills: Secondary | ICD-10-CM | POA: Diagnosis not present

## 2020-05-21 DIAGNOSIS — E782 Mixed hyperlipidemia: Secondary | ICD-10-CM | POA: Diagnosis not present

## 2020-05-21 LAB — HEMOCCULT GUIAC POC 1CARD (OFFICE): Fecal Occult Blood, POC: NEGATIVE

## 2020-05-21 MED ORDER — LEVONORG-ETH ESTRAD TRIPHASIC 50-30/75-40/ 125-30 MCG PO TABS: 1.0000 | ORAL_TABLET | Freq: Every day | ORAL | 12 refills | Status: DC

## 2020-05-21 NOTE — Progress Notes (Signed)
Patient ID: Ashley Wong, female   DOB: [birth year], 55 y.o.   MRN: [id number] History of Present Illness: Ashley Wong is a 55 year old white female,married, G1P0001, in for a well woman gyn exam, she had a normal pap with negative HPV 04/05/19. 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 She has gotten 3 COVID vaccines. PCP is Dr Woody Seller.   Current Medications, Allergies, Past Medical History, Past Surgical History, Family History and Social History were reviewed in [employer] record.     Review of Systems: Patient denies any headaches, hearing loss, fatigue, blurred vision, shortness of breath, chest pain, abdominal pain, problems with bowel movements, urination, or intercourse. No joint pain or mood swings. Still has periods, on OCs    Physical Exam:BP 122/83 (BP Location: Right Arm, Patient Position: Sitting, Cuff Size: Normal)   Pulse 85   Ht 5' (1.524 m)   Wt 161 lb 3.2 oz (73.1 kg)   LMP 05/02/2020 (Exact Date)   BMI 31.48 kg/m  General:  Well developed, well nourished, no acute distress Skin:  Warm and dry Neck:  Midline trachea, normal thyroid, good ROM, no lymphadenopathy Lungs; Clear to auscultation bilaterally Breast:  No dominant palpable mass, retraction, or nipple discharge Cardiovascular: Regular rate and rhythm Abdomen:  Soft, non tender, no hepatosplenomegaly Pelvic:  External genitalia is normal in appearance, no lesions.  The vagina is normal in appearance. Urethra has no lesions or masses. The cervix is bulbous.  Uterus is felt to be normal size, shape, and contour.  No adnexal masses or tenderness noted.Bladder is non tender, no masses felt. Rectal: Good sphincter tone, no polyps, or hemorrhoids felt.  Hemoccult negative. Extremities/musculoskeletal:  No swelling or varicosities noted, no clubbing or cyanosis Psych:  No mood changes, alert and cooperative,seems happy AA is 0 Fall risk is  low PHQ 9 score is 0 GAAD 7 score is 0  Upstream - 05/21/20 0838      Pregnancy Intention Screening   Does the patient want to become pregnant in the next year? No    Does the patient's partner want to become pregnant in the next year? No    Would the patient like to discuss contraceptive options today? No      Contraception Wrap Up   Current Method Oral Contraceptive    End Method Oral Contraceptive    Contraception Counseling Provided No         Examination chaperoned by Glenard Haring RN  Impression and Plan: 1. Encounter for well woman exam with routine gynecological exam Physical in 1 year Pap in 2024 - CBC - Comprehensive metabolic panel - TSH Mammogram yearly Colonoscopy in 2024  2. Encounter for screening fecal occult blood testing - POCT occult blood stool  3. Encounter for surveillance of contraceptive pills Will continue OCs Meds ordered this encounter  Medications  . Levonorg-Eth Estrad Triphasic (ENPRESSE-28) 50-30/75-40/ 125-30 MCG TABS    Sig: Take 1 tablet by mouth daily.    Dispense:  28 tablet    Refill:  12    Order Specific Question:   Supervising Provider    Answer:   Elonda Husky, LUTHER H [[id number]]    4. Elevated cholesterol with elevated triglycerides - Lipid panel  5. Vitamin D deficiency - VITAMIN D 25 Hydroxy (Vit-D Deficiency, Fractures) Do better taking Vitamin D 3, try gel cap

## 2020-05-22 LAB — TSH: TSH: 1.46 u[IU]/mL (ref 0.450–4.500)

## 2020-05-22 LAB — LIPID PANEL
Chol/HDL Ratio: 4.7 ratio — ABNORMAL HIGH (ref 0.0–4.4)
Cholesterol, Total: 212 mg/dL — ABNORMAL HIGH (ref 100–199)
HDL: 45 mg/dL (ref >39)
LDL Chol Calc (NIH): 142 mg/dL — ABNORMAL HIGH (ref 0–99)
Triglycerides: 136 mg/dL (ref 0–149)
VLDL Cholesterol Cal: 25 mg/dL (ref 5–40)

## 2020-05-22 LAB — CBC
Hematocrit: 39.8 % (ref 34.0–46.6)
Hemoglobin: 12.9 g/dL (ref 11.1–15.9)
MCH: 27.7 pg (ref 26.6–33.0)
MCHC: 32.4 g/dL (ref 31.5–35.7)
MCV: 86 fL (ref 79–97)
Platelets: 294 10*3/uL (ref 150–450)
RBC: 4.65 x10E6/uL (ref 3.77–5.28)
RDW: 12.8 % (ref 11.7–15.4)
WBC: 7.5 10*3/uL (ref 3.4–10.8)

## 2020-05-22 LAB — COMPREHENSIVE METABOLIC PANEL
ALT: 10 IU/L (ref 0–32)
AST: 13 IU/L (ref 0–40)
Albumin/Globulin Ratio: 1.7 (ref 1.2–2.2)
Albumin: 4.6 g/dL (ref 3.8–4.9)
Alkaline Phosphatase: 74 IU/L (ref 44–121)
BUN/Creatinine Ratio: 12 (ref 9–23)
BUN: 12 mg/dL (ref 6–24)
Bilirubin Total: 0.4 mg/dL (ref 0.0–1.2)
CO2: 22 mmol/L (ref 20–29)
Calcium: 9.9 mg/dL (ref 8.7–10.2)
Chloride: 102 mmol/L (ref 96–106)
Creatinine, Ser: 0.99 mg/dL (ref 0.57–1.00)
Globulin, Total: 2.7 g/dL (ref 1.5–4.5)
Glucose: 95 mg/dL (ref 65–99)
Potassium: 4.9 mmol/L (ref 3.5–5.2)
Sodium: 140 mmol/L (ref 134–144)
Total Protein: 7.3 g/dL (ref 6.0–8.5)
eGFR: 68 mL/min/{1.73_m2} (ref >59)

## 2020-05-22 LAB — VITAMIN D 25 HYDROXY (VIT D DEFICIENCY, FRACTURES): Vit D, 25-Hydroxy: 26.8 ng/mL — ABNORMAL LOW (ref 30.0–100.0)

## 2020-05-30 ENCOUNTER — Other Ambulatory Visit: Payer: Self-pay | Admitting: Adult Health

## 2021-03-26 ENCOUNTER — Other Ambulatory Visit (HOSPITAL_COMMUNITY): Payer: Self-pay | Admitting: Obstetrics & Gynecology

## 2021-03-26 DIAGNOSIS — Z1231 Encounter for screening mammogram for malignant neoplasm of breast: Secondary | ICD-10-CM

## 2021-04-01 ENCOUNTER — Other Ambulatory Visit: Payer: Self-pay

## 2021-04-01 ENCOUNTER — Ambulatory Visit (HOSPITAL_COMMUNITY)
Admission: RE | Admit: 2021-04-01 | Discharge: 2021-04-01 | Disposition: A | Discharge status: Home / Self Care | Payer: BC Managed Care – PPO | Source: Ambulatory Visit | Attending: Obstetrics & Gynecology | Admitting: Obstetrics & Gynecology

## 2021-04-01 DIAGNOSIS — Z1231 Encounter for screening mammogram for malignant neoplasm of breast: Secondary | ICD-10-CM | POA: Diagnosis not present

## 2021-05-25 ENCOUNTER — Encounter: Payer: Self-pay | Admitting: Adult Health

## 2021-05-25 ENCOUNTER — Ambulatory Visit (INDEPENDENT_AMBULATORY_CARE_PROVIDER_SITE_OTHER): Payer: BC Managed Care – PPO | Admitting: Adult Health

## 2021-05-25 VITALS — BP 133/87 | HR 82 | Ht 59.0 in | Wt 165.5 lb

## 2021-05-25 DIAGNOSIS — Z3041 Encounter for surveillance of contraceptive pills: Secondary | ICD-10-CM

## 2021-05-25 DIAGNOSIS — Z01419 Encounter for gynecological examination (general) (routine) without abnormal findings: Secondary | ICD-10-CM | POA: Diagnosis not present

## 2021-05-25 DIAGNOSIS — E782 Mixed hyperlipidemia: Secondary | ICD-10-CM

## 2021-05-25 DIAGNOSIS — Z1211 Encounter for screening for malignant neoplasm of colon: Secondary | ICD-10-CM

## 2021-05-25 LAB — HEMOCCULT GUIAC POC 1CARD (OFFICE): Fecal Occult Blood, POC: NEGATIVE

## 2021-05-25 MED ORDER — LEVONORG-ETH ESTRAD TRIPHASIC 50-30/75-40/ 125-30 MCG PO TABS: 1.0000 | ORAL_TABLET | Freq: Every day | ORAL | 4 refills | Status: DC

## 2021-05-25 NOTE — Progress Notes (Signed)
Patient ID: Ashley Wong, female   DOB: 12-31-1965, 56 y.o.   MRN: [id number] ?History of Present Illness: ? ?Ashley Wong is a 56 year old white female,married, G1P1 in for well woman gyn exam. 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 ? ?Lab Results  ?Component Value Date  ? DIAGPAP  04/05/2019  ?  - Negative for intraepithelial lesion or malignancy (NILM)  ? HPV NOT DETECTED 12/06/2016  ? [place] Negative 04/05/2019  ? PCP is Dr Ashley Wong. ? ?Current Medications, Allergies, Past Medical History, Past Surgical History, Family History and Social History were reviewed in [employer] record.   ? ? ?Review of Systems: ?Patient denies any headaches, hearing loss, fatigue, blurred vision, shortness of breath, chest pain, abdominal pain, problems with bowel movements, urination, or intercourse. No joint pain or mood swings.  ?Periods still regular, about 4 days every month. ? ? ?Physical Exam:BP 133/87 (BP Location: Left Arm, Patient Position: Sitting, Cuff Size: Normal)   Pulse 82   Ht '4\' 11"'$  (1.499 m)   Wt 165 lb 8 oz (75.1 kg)   LMP 05/01/2021 (Approximate)   BMI 33.43 kg/m?   ?General:  Well developed, well nourished, no acute distress ?Skin:  Warm and dry ?Neck:  Midline trachea, normal thyroid, good ROM, no lymphadenopathy ?Lungs; Clear to auscultation bilaterally ?Breast:  No dominant palpable mass, retraction, or nipple discharge ?Cardiovascular: Regular rate and rhythm ?Abdomen:  Soft, non tender, no hepatosplenomegaly ?Pelvic:  External genitalia is normal in appearance, no lesions.  The vagina is normal in appearance. Urethra has no lesions or masses. The cervix is smooth.  Uterus is felt to be normal size, shape, and contour.  No adnexal masses or tenderness noted.Bladder is non tender, no masses felt. ?Rectal: Good sphincter tone, no polyps, or hemorrhoids felt.  Hemoccult negative. ?Extremities/musculoskeletal:  No swelling or varicosities noted, no clubbing or  cyanosis ?Psych:  No mood changes, alert and cooperative,seems happy ?AA is 0 ?Fall risk is low ? ?  05/25/2021  ? 11:00 AM 05/21/2020  ?  8:38 AM 04/05/2019  ?  8:27 AM  ?Depression screen PHQ 2/9  ?Decreased Interest 0 0 0  ?Down, Depressed, Hopeless 0 0 0  ?PHQ - 2 Score 0 0 0  ?Altered sleeping 0 0   ?Tired, decreased energy 0 0   ?Change in appetite 0 0   ?Feeling bad or failure about yourself  0 0   ?Trouble concentrating 0 0   ?Moving slowly or fidgety/restless 0 0   ?Suicidal thoughts 0 0   ?PHQ-9 Score 0 0   ?  ? ?  05/25/2021  ? 11:00 AM 05/21/2020  ?  8:38 AM  ?GAD 7 : Generalized Anxiety Score  ?Nervous, Anxious, on Edge 0 0  ?Control/stop worrying 0 0  ?Worry too much - different things 0 0  ?Trouble relaxing 0 0  ?Restless 0 0  ?Easily annoyed or irritable 0 0  ?Afraid - awful might happen 0 0  ?Total GAD 7 Score 0 0  ? ? Upstream - 05/25/21 1106   ? ?  ? Pregnancy Intention Screening  ? Does the patient want to become pregnant in the next year? No   ? Does the patient's partner want to become pregnant in the next year? No   ? Would the patient like to discuss contraceptive options today? No   ?  ? Contraception Wrap Up  ? Current Method Oral Contraceptive   ? End  Method Oral Contraceptive   ? ?  ?  ? ?  ? Examination chaperoned by Ashley Pupa LPN ? ?  ?Impression and Plan: ?1. Encounter for well woman exam with routine gynecological exam ?Pap and physical in 1 year ?Had normal mammogram 04/01/21 ?Colonoscopy per GI ?Check CBC,CMP,TSH and lipids  ? ?2. Encounter for surveillance of contraceptive pills ?Will continue Enpresse ?Discussed some have periods til 57 or so.  ?Meds ordered this encounter  ?Medications  ? Levonorg-Eth Estrad Triphasic (ENPRESSE-28) 50-30/75-40/ 125-30 MCG TABS  ?  Sig: Take 1 tablet by mouth daily.  ?  Dispense:  84 tablet  ?  Refill:  4  ?  Order Specific Question:   Supervising Provider  ?  Answer:   Ashley Wong [[id number]]  ?  ? ?3. Encounter for screening fecal occult blood  testing ?Hemoccult was negative  ? ?4. Elevated cholesterol with elevated triglycerides ?Check lipid panel ? ? ? ?  ?  ?

## 2021-07-04 LAB — COMPREHENSIVE METABOLIC PANEL
ALT: 13 IU/L (ref 0–32)
AST: 14 IU/L (ref 0–40)
Albumin/Globulin Ratio: 1.9 (ref 1.2–2.2)
Albumin: 4.4 g/dL (ref 3.8–4.9)
Alkaline Phosphatase: 66 IU/L (ref 44–121)
BUN/Creatinine Ratio: 14 (ref 9–23)
BUN: 12 mg/dL (ref 6–24)
Bilirubin Total: 0.3 mg/dL (ref 0.0–1.2)
CO2: 23 mmol/L (ref 20–29)
Calcium: 9.8 mg/dL (ref 8.7–10.2)
Chloride: 104 mmol/L (ref 96–106)
Creatinine, Ser: 0.87 mg/dL (ref 0.57–1.00)
Globulin, Total: 2.3 g/dL (ref 1.5–4.5)
Glucose: 94 mg/dL (ref 70–99)
Potassium: 4.5 mmol/L (ref 3.5–5.2)
Sodium: 141 mmol/L (ref 134–144)
Total Protein: 6.7 g/dL (ref 6.0–8.5)
eGFR: 79 mL/min/{1.73_m2} (ref >59)

## 2021-07-04 LAB — LIPID PANEL
Chol/HDL Ratio: 4.2 ratio (ref 0.0–4.4)
Cholesterol, Total: 180 mg/dL (ref 100–199)
HDL: 43 mg/dL (ref >39)
LDL Chol Calc (NIH): 105 mg/dL — ABNORMAL HIGH (ref 0–99)
Triglycerides: 184 mg/dL — ABNORMAL HIGH (ref 0–149)
VLDL Cholesterol Cal: 32 mg/dL (ref 5–40)

## 2021-07-04 LAB — CBC
Hematocrit: 38.4 % (ref 34.0–46.6)
Hemoglobin: 12.3 g/dL (ref 11.1–15.9)
MCH: 25.6 pg — ABNORMAL LOW (ref 26.6–33.0)
MCHC: 32 g/dL (ref 31.5–35.7)
MCV: 80 fL (ref 79–97)
Platelets: 325 10*3/uL (ref 150–450)
RBC: 4.8 x10E6/uL (ref 3.77–5.28)
RDW: 18 % — ABNORMAL HIGH (ref 11.7–15.4)
WBC: 7.6 10*3/uL (ref 3.4–10.8)

## 2021-07-04 LAB — TSH: TSH: 1.55 u[IU]/mL (ref 0.450–4.500)

## 2022-03-18 ENCOUNTER — Encounter: Payer: Self-pay | Admitting: *Deleted

## 2022-04-06 ENCOUNTER — Telehealth (INDEPENDENT_AMBULATORY_CARE_PROVIDER_SITE_OTHER): Payer: Self-pay | Admitting: *Deleted

## 2022-04-06 NOTE — Telephone Encounter (Signed)
  Procedure: Colonoscopy  Height: 5'0 Weight: 167lbs        Have you had a colonoscopy before?  04/2019, Dr. Oneida Alar  Do you have family history of colon cancer?  mother  Do you have a family history of polyps? yes  Previous colonoscopy with polyps removed? yes  Do you have a history colorectal cancer?   no  Are you diabetic?  no  Do you have a prosthetic or mechanical heart valve? no  Do you have a pacemaker/defibrillator?   no  Have you had endocarditis/atrial fibrillation?  no  Do you use supplemental oxygen/CPAP?  no  Have you had joint replacement within the last 12 months?  no  Do you tend to be constipated or have to use laxatives?  Yes, occasionally   Do you have history of alcohol use? If yes, how much and how often.  no  Do you have history or are you using drugs? If yes, what do are you  using?  no  Have you ever had a stroke/heart attack?  no  Have you ever had a heart or other vascular stent placed,?no  Do you take weight loss medication? no  female patients,: have you had a hysterectomy? no                              are you post menopausal?  no                              do you still have your menstrual cycle? yes    Date of last menstrual period? 02/26/22  Do you take any blood-thinning medications such as: (Plavix, aspirin, Coumadin, Aggrenox, Brilinta, Xarelto, Eliquis, Pradaxa, Savaysa or Effient)? no  If yes we need the name, milligram, dosage and who is prescribing doctor:               Current Outpatient Medications  Medication Sig Dispense Refill   Ferrous Sulfate (IRON PO) Take by mouth. Prior to blood donations     ibuprofen (ADVIL) 200 MG tablet Take 400 mg by mouth every 8 (eight) hours as needed for headache or moderate pain.     Levonorg-Eth Estrad Triphasic (ENPRESSE-28) 50-30/75-40/ 125-30 MCG TABS Take 1 tablet by mouth daily. 84 tablet 4   RED YEAST RICE EXTRACT PO Take 2 capsules by mouth every evening.      No current  facility-administered medications for this visit.    Allergies  Allergen Reactions   Penicillins Anaphylaxis    Did it involve swelling of the face/tongue/throat, SOB, or low BP? Yes Did it involve sudden or severe rash/hives, skin peeling, or any reaction on the inside of your mouth or nose? No Did you need to seek medical attention at a hospital or doctor's office? At the dr's office when it happened When did it last happen?      Childhood allergy If all above answers are "NO", may proceed with cephalosporin use.    Zithromax [Azithromycin] Nausea And Vomiting    Z pack   Sulfa Antibiotics Rash

## 2022-04-08 ENCOUNTER — Other Ambulatory Visit (HOSPITAL_COMMUNITY): Payer: Self-pay | Admitting: Obstetrics & Gynecology

## 2022-04-08 DIAGNOSIS — Z1231 Encounter for screening mammogram for malignant neoplasm of breast: Secondary | ICD-10-CM

## 2022-04-15 ENCOUNTER — Encounter (HOSPITAL_COMMUNITY): Payer: Self-pay | Admitting: Radiology

## 2022-04-15 ENCOUNTER — Inpatient Hospital Stay (HOSPITAL_COMMUNITY): Admission: RE | Admit: 2022-04-15 | Payer: BC Managed Care – PPO | Source: Ambulatory Visit

## 2022-04-15 ENCOUNTER — Ambulatory Visit (HOSPITAL_COMMUNITY)
Admission: RE | Admit: 2022-04-15 | Discharge: 2022-04-15 | Disposition: A | Discharge status: Home / Self Care | Payer: BC Managed Care – PPO | Source: Ambulatory Visit | Attending: Obstetrics & Gynecology | Admitting: Obstetrics & Gynecology

## 2022-04-15 DIAGNOSIS — Z1231 Encounter for screening mammogram for malignant neoplasm of breast: Secondary | ICD-10-CM | POA: Diagnosis present

## 2022-04-20 NOTE — Telephone Encounter (Signed)
Recommend office visit due to constipation.

## 2022-04-23 ENCOUNTER — Encounter: Payer: Self-pay | Admitting: *Deleted

## 2022-04-23 NOTE — Telephone Encounter (Signed)
Apt scheduled.  

## 2022-05-23 NOTE — Progress Notes (Unsigned)
Referring Provider:*** Primary Care Physician:  Ignatius Specking, MD Primary Gastroenterologist:  Dr. Bonnetta Barry chief complaint on file.   HPI:   Ashley Wong is a 57 y.o. female presenting today to discuss scheduling surveillance colonoscopy.  We received triage questionnaire in the mail and patient reported constipation, thus recommended office visit prior to scheduling colonoscopy.  Last colonoscopy 04/23/2019 with eight 2-8 mm polyps removed, external and internal hemorrhoids, tortuous colon.  Pathology was tubular adenomas and hyperplastic polyps.  Recommended 3-year surveillance.     Past Medical History:  Diagnosis Date   Contraceptive management 11/19/2013   Fecal occult blood test positive 10/13/2015   Will sent 3 cards home   Hemorrhoids    Urinary frequency 12/18/2013    Past Surgical History:  Procedure Laterality Date   CESAREAN SECTION  2004   COLONOSCOPY N/A 12/05/2015   Procedure: COLONOSCOPY;  Surgeon: West Bali, MD;  Location: [facility];  Service: Endoscopy;  Laterality: N/A;  245   COLONOSCOPY N/A 04/23/2019   Procedure: COLONOSCOPY;  Surgeon: West Bali, MD;  Location: [facility];  Service: Endoscopy;  Laterality: N/A;  1:00   POLYPECTOMY  12/05/2015   Procedure: POLYPECTOMY;  Surgeon: West Bali, MD;  Location: [facility];  Service: Endoscopy;;  colon   POLYPECTOMY  04/23/2019   Procedure: POLYPECTOMY;  Surgeon: West Bali, MD;  Location: [facility];  Service: Endoscopy;;    Current Outpatient Medications  Medication Sig Dispense Refill   Ferrous Sulfate (IRON PO) Take by mouth. Prior to blood donations     ibuprofen (ADVIL) 200 MG tablet Take 400 mg by mouth every 8 (eight) hours as needed for headache or moderate pain.     Levonorg-Eth Estrad Triphasic (ENPRESSE-28) 50-30/75-40/ 125-30 MCG TABS Take 1 tablet by mouth daily. 84 tablet 4   RED YEAST RICE EXTRACT PO Take 2 capsules by mouth every evening.      No current  facility-administered medications for this visit.    Allergies as of 05/26/2022 - Review Complete 05/25/2021  Allergen Reaction Noted   Penicillins Anaphylaxis 11/19/2013   Zithromax [azithromycin] Nausea And Vomiting 11/19/2013   Sulfa antibiotics Rash 11/19/2013    Family History  Problem Relation Age of Onset   Heart disease Mother    Colon cancer Mother 69       s/p surgery, no adjuvent therapy    Social History   Socioeconomic History   Marital status: Married    Spouse name: Not on file   Number of children: Not on file   Years of education: Not on file   Highest education level: Not on file  Occupational History   Not on file  Tobacco Use   Smoking status: Never   Smokeless tobacco: Never  Vaping Use   Vaping Use: Never used  Substance and Sexual Activity   Alcohol use: No   Drug use: No   Sexual activity: Yes    Birth control/protection: Pill  Other Topics Concern   Not on file  Social History Narrative   Teacher   Social Determinants of Health   Financial Resource Strain: Low Risk  (05/25/2021)   Overall Financial Resource Strain (CARDIA)    Difficulty of Paying Living Expenses: Not hard at all  Food Insecurity: No Food Insecurity (05/25/2021)   Hunger Vital Sign    Worried About Running Out of Food in the Last Year: Never true    Ran Out of Food  in the Last Year: Never true  Transportation Needs: No Transportation Needs (05/25/2021)   PRAPARE - Administrator, Civil Service (Medical): No    Lack of Transportation (Non-Medical): No  Physical Activity: Sufficiently Active (05/25/2021)   Exercise Vital Sign    Days of Exercise per Week: 7 days    Minutes of Exercise per Session: 40 min  Stress: No Stress Concern Present (05/25/2021)   [employer] of Occupational Health - Occupational Stress Questionnaire    Feeling of Stress : Not at all  Social Connections: Socially Integrated (05/25/2021)   Social Connection and Isolation Panel  [NHANES]    Frequency of Communication with Friends and Family: More than three times a week    Frequency of Social Gatherings with Friends and Family: More than three times a week    Attends Religious Services: More than 4 times per year    Active Member of [employer] or Organizations: Yes    Attends Engineer, structural: More than 4 times per year    Marital Status: Married  Catering manager Violence: Not At Risk (05/25/2021)   Humiliation, Afraid, Rape, and Kick questionnaire    Fear of Current or Ex-Partner: No    Emotionally Abused: No    Physically Abused: No    Sexually Abused: No    Review of Systems: Gen: Denies any fever, chills, fatigue, weight loss, lack of appetite.  CV: Denies chest pain, heart palpitations, peripheral edema, syncope.  Resp: Denies shortness of breath at rest or with exertion. Denies wheezing or cough.  GI: Denies dysphagia or odynophagia. Denies jaundice, hematemesis, fecal incontinence. GU : Denies urinary burning, urinary frequency, urinary hesitancy MS: Denies joint pain, muscle weakness, cramps, or limitation of movement.  Derm: Denies rash, itching, dry skin Psych: Denies depression, anxiety, memory loss, and confusion Heme: Denies bruising, bleeding, and enlarged lymph nodes.  Physical Exam: There were no vitals taken for this visit. General:   Alert and oriented. Pleasant and cooperative. Well-nourished and well-developed.  Head:  Normocephalic and atraumatic. Eyes:  Without icterus, sclera clear and conjunctiva pink.  Ears:  Normal auditory acuity. Lungs:  Clear to auscultation bilaterally. No wheezes, rales, or rhonchi. No distress.  Heart:  S1, S2 present without murmurs appreciated.  Abdomen:  +BS, soft, non-tender and non-distended. No HSM noted. No guarding or rebound. No masses appreciated.  Rectal:  Deferred  Msk:  Symmetrical without gross deformities. Normal posture. Extremities:  Without edema. Neurologic:  Alert and  oriented  x4;  grossly normal neurologically. Skin:  Intact without significant lesions or rashes. Psych:  Alert and cooperative. Normal mood and affect.    Assessment:     Plan:  ***   Ermalinda Memos, PA-C [facility] 05/26/2022

## 2022-05-26 ENCOUNTER — Ambulatory Visit: Payer: BC Managed Care – PPO | Admitting: Gastroenterology

## 2022-05-26 ENCOUNTER — Telehealth: Payer: Self-pay | Admitting: *Deleted

## 2022-05-26 ENCOUNTER — Encounter: Payer: Self-pay | Admitting: Gastroenterology

## 2022-05-26 VITALS — BP 135/81 | HR 89 | Temp 97.6°F | Ht 60.0 in | Wt 167.4 lb

## 2022-05-26 DIAGNOSIS — Z86010 Personal history of colonic polyps: Secondary | ICD-10-CM

## 2022-05-26 NOTE — Telephone Encounter (Signed)
[facility]  TCS w/Dr.Carver, ASA 2 Prefers M or F

## 2022-05-26 NOTE — Patient Instructions (Signed)
We will arrange to have a colonoscopy in the near future with Dr. Marletta Lor at [facility]. Be sure not to take any iron within 7 days of your procedure. Take MiraLAX 1 capful (17 g) daily in 8 ounces of water, or other noncarbonated beverage of your choice.  We will follow-up with you after colonoscopy as needed.  Do not hesitate to call if you develop any new GI concerns.  It was very nice to meet you today!  Ermalinda Memos, PA-C [facility]

## 2022-05-27 ENCOUNTER — Encounter: Payer: Self-pay | Admitting: *Deleted

## 2022-05-27 ENCOUNTER — Telehealth: Payer: Self-pay | Admitting: *Deleted

## 2022-05-27 ENCOUNTER — Other Ambulatory Visit: Payer: Self-pay | Admitting: *Deleted

## 2022-05-27 MED ORDER — NA SULFATE-K SULFATE-MG SULF 17.5-3.13-1.6 GM/177ML PO SOLN: ORAL | 0 refills | Status: DC

## 2022-05-27 NOTE — Telephone Encounter (Signed)
Pt has been scheduled for 07/02/22. Instructions mailed and prep sent to the pharmacy.

## 2022-05-27 NOTE — Telephone Encounter (Signed)
error 

## 2022-05-28 ENCOUNTER — Other Ambulatory Visit: Payer: Self-pay | Admitting: *Deleted

## 2022-05-28 MED ORDER — LEVONORG-ETH ESTRAD TRIPHASIC 50-30/75-40/ 125-30 MCG PO TABS: 1.0000 | ORAL_TABLET | Freq: Every day | ORAL | 0 refills | Status: DC

## 2022-06-07 ENCOUNTER — Ambulatory Visit (INDEPENDENT_AMBULATORY_CARE_PROVIDER_SITE_OTHER): Payer: BC Managed Care – PPO | Admitting: Adult Health

## 2022-06-07 ENCOUNTER — Other Ambulatory Visit (HOSPITAL_COMMUNITY)
Admission: RE | Admit: 2022-06-07 | Discharge: 2022-06-07 | Disposition: A | Discharge status: Home / Self Care | Payer: BC Managed Care – PPO | Source: Ambulatory Visit | Attending: Adult Health | Admitting: Adult Health

## 2022-06-07 ENCOUNTER — Encounter: Payer: Self-pay | Admitting: Adult Health

## 2022-06-07 VITALS — BP 124/81 | HR 81 | Ht 60.0 in | Wt 166.5 lb

## 2022-06-07 DIAGNOSIS — Z3041 Encounter for surveillance of contraceptive pills: Secondary | ICD-10-CM

## 2022-06-07 DIAGNOSIS — Z01419 Encounter for gynecological examination (general) (routine) without abnormal findings: Secondary | ICD-10-CM

## 2022-06-07 NOTE — Progress Notes (Signed)
Patient ID: Ashley Wong, female   DOB: 10-25-1965, 57 y.o.   MRN: [id number] History of Present Illness: Ashley Wong is a 57 year old white female,married, G1P0001 in for a well woman gyn exam and pap. She is till on COCs and has regular lite period, no hot flashes.  PCP is Dr Sherril Croon   Current Medications, Allergies, Past Medical History, Past Surgical History, Family History and Social History were reviewed in [employer] electronic medical record.     Review of Systems: Patient denies any headaches, hearing loss, fatigue, blurred vision, shortness of breath, chest pain, abdominal pain, problems with bowel movements, urination, or intercourse. No joint pain or mood swings. See HPI for positives.    Physical Exam:BP 124/81 (BP Location: Left Arm, Patient Position: Sitting, Cuff Size: Normal)   Pulse 81   Ht 5' (1.524 m)   Wt 166 lb 8 oz (75.5 kg)   LMP 06/05/2022   BMI 32.52 kg/m   General:  Well developed, well nourished, no acute distress Skin:  Warm and dry Neck:  Midline trachea, normal thyroid, good ROM, no lymphadenopathy Lungs; Clear to auscultation bilaterally Breast:  No dominant palpable mass, retraction, or nipple discharge Cardiovascular: Regular rate and rhythm Abdomen:  Soft, non tender, no hepatosplenomegaly Pelvic:  External genitalia is normal in appearance, no lesions.  The vagina is normal in appearance. Urethra has no lesions or masses. The cervix is smooth, pap with HR HPV genotyping performed.  Uterus is felt to be normal size, shape, and contour.  No adnexal masses or tenderness noted.Bladder is non tender, no masses felt. Rectal: Deferred Extremities/musculoskeletal:  No swelling or varicosities noted, no clubbing or cyanosis Psych:  No mood changes, alert and cooperative,seems happy AA is 0 Fall risk is low     06/07/2022    8:37 AM 05/25/2021   11:00 AM 05/21/2020    8:38 AM  Depression screen PHQ 2/9  Decreased Interest 0 0 0  Down, Depressed,  Hopeless 0 0 0  PHQ - 2 Score 0 0 0  Altered sleeping 0 0 0  Tired, decreased energy 0 0 0  Change in appetite 0 0 0  Feeling bad or failure about yourself  0 0 0  Trouble concentrating 0 0 0  Moving slowly or fidgety/restless 0 0 0  Suicidal thoughts 0 0 0  PHQ-9 Score 0 0 0       06/07/2022    8:38 AM 05/25/2021   11:00 AM 05/21/2020    8:38 AM  GAD 7 : Generalized Anxiety Score  Nervous, Anxious, on Edge 0 0 0  Control/stop worrying 0 0 0  Worry too much - different things 0 0 0  Trouble relaxing 0 0 0  Restless 0 0 0  Easily annoyed or irritable 0 0 0  Afraid - awful might happen 0 0 0  Total GAD 7 Score 0 0 0      Upstream - 06/07/22 0843       Pregnancy Intention Screening   Does the patient want to become pregnant in the next year? No    Does the patient's partner want to become pregnant in the next year? No    Would the patient like to discuss contraceptive options today? No      Contraception Wrap Up   Current Method Oral Contraceptive    End Method Oral Contraceptive    Contraception Counseling Provided No            Examination chaperoned  by Malachy Mood LPN   Impression and Plan: 1. Encounter for gynecological examination with Papanicolaou smear of cervix Pap sent Pap in 3 years if normal Physical in 1 year  - Cytology - PAP( [facility])  Having colonoscopy 07/02/22 Had negative mammogram 04/15/22  2. Contraceptive management Still having regular lite periods, no hot flashes, discussed with Dr Despina Hidden  Will stop pills after this pack and use condoms Will check [facility] after 1 month off pills. She will call me

## 2022-06-08 LAB — CYTOLOGY - PAP
Adequacy: ABSENT
Comment: NEGATIVE
Diagnosis: NEGATIVE
High risk HPV: NEGATIVE

## 2022-06-30 ENCOUNTER — Encounter (HOSPITAL_COMMUNITY)
Admission: RE | Admit: 2022-06-30 | Discharge: 2022-06-30 | Disposition: A | Discharge status: Home / Self Care | Payer: BC Managed Care – PPO | Source: Ambulatory Visit

## 2022-06-30 ENCOUNTER — Encounter (HOSPITAL_COMMUNITY): Payer: Self-pay

## 2022-06-30 ENCOUNTER — Other Ambulatory Visit (HOSPITAL_COMMUNITY)
Admission: RE | Admit: 2022-06-30 | Discharge: 2022-06-30 | Disposition: A | Discharge status: Home / Self Care | Payer: BC Managed Care – PPO | Source: Ambulatory Visit | Attending: Internal Medicine | Admitting: Internal Medicine

## 2022-06-30 VITALS — Ht 60.0 in | Wt 165.0 lb

## 2022-06-30 DIAGNOSIS — Z01818 Encounter for other preprocedural examination: Secondary | ICD-10-CM

## 2022-06-30 DIAGNOSIS — Z800 Family history of malignant neoplasm of digestive organs: Secondary | ICD-10-CM | POA: Diagnosis not present

## 2022-06-30 DIAGNOSIS — Z01812 Encounter for preprocedural laboratory examination: Secondary | ICD-10-CM | POA: Insufficient documentation

## 2022-06-30 DIAGNOSIS — Z86010 Personal history of colonic polyps: Secondary | ICD-10-CM | POA: Diagnosis present

## 2022-06-30 DIAGNOSIS — Z1211 Encounter for screening for malignant neoplasm of colon: Secondary | ICD-10-CM | POA: Diagnosis not present

## 2022-06-30 DIAGNOSIS — K648 Other hemorrhoids: Secondary | ICD-10-CM | POA: Diagnosis not present

## 2022-06-30 LAB — PREGNANCY, URINE: Preg Test, Ur: NEGATIVE

## 2022-07-02 ENCOUNTER — Encounter (HOSPITAL_COMMUNITY): Payer: Self-pay

## 2022-07-02 ENCOUNTER — Ambulatory Visit (HOSPITAL_COMMUNITY): Payer: BC Managed Care – PPO | Admitting: Certified Registered Nurse Anesthetist

## 2022-07-02 ENCOUNTER — Ambulatory Visit (HOSPITAL_COMMUNITY)
Admission: RE | Admit: 2022-07-02 | Discharge: 2022-07-02 | Disposition: A | Discharge status: Home / Self Care | Payer: BC Managed Care – PPO | Attending: Internal Medicine | Admitting: Internal Medicine

## 2022-07-02 ENCOUNTER — Encounter (HOSPITAL_COMMUNITY)
Admission: RE | Disposition: A | Discharge status: Home / Self Care | Payer: Self-pay | Source: Home / Self Care | Attending: Internal Medicine

## 2022-07-02 ENCOUNTER — Other Ambulatory Visit: Payer: Self-pay

## 2022-07-02 DIAGNOSIS — Z86010 Personal history of colonic polyps: Secondary | ICD-10-CM | POA: Diagnosis not present

## 2022-07-02 DIAGNOSIS — Z1211 Encounter for screening for malignant neoplasm of colon: Secondary | ICD-10-CM | POA: Insufficient documentation

## 2022-07-02 DIAGNOSIS — K648 Other hemorrhoids: Secondary | ICD-10-CM | POA: Diagnosis not present

## 2022-07-02 DIAGNOSIS — Z01818 Encounter for other preprocedural examination: Secondary | ICD-10-CM

## 2022-07-02 DIAGNOSIS — Z800 Family history of malignant neoplasm of digestive organs: Secondary | ICD-10-CM | POA: Insufficient documentation

## 2022-07-02 HISTORY — PX: COLONOSCOPY WITH PROPOFOL: SHX5780

## 2022-07-02 SURGERY — COLONOSCOPY WITH PROPOFOL
Anesthesia: General

## 2022-07-02 MED ORDER — PROPOFOL 500 MG/50ML IV EMUL
INTRAVENOUS | Status: DC | PRN
  Administered 2022-07-02: 150 ug/kg/min via INTRAVENOUS

## 2022-07-02 MED ORDER — LACTATED RINGERS IV SOLN: INTRAVENOUS | Status: DC | PRN

## 2022-07-02 MED ORDER — PROPOFOL 10 MG/ML IV BOLUS
INTRAVENOUS | Status: DC | PRN
  Administered 2022-07-02: 100 mg via INTRAVENOUS

## 2022-07-02 MED ORDER — STERILE WATER FOR IRRIGATION IR SOLN
Status: DC | PRN
  Administered 2022-07-02: .6 mL

## 2022-07-02 NOTE — Op Note (Signed)
[facility] Patient Name: Ashley Wong Procedure Date: 07/02/2022 9:40 AM MRN: [id number] Date of Birth: 02-13-1965 Attending MD: Hennie Duos. Marletta Lor , [place], [id number] CSN: [id number] Age: 57 Admit Type: Outpatient Procedure:                Colonoscopy Indications:              Surveillance: Personal history of adenomatous                            polyps on last colonoscopy 5 years ago Providers:                Hennie Duos. Marletta Lor, DO, Jannett Celestine, RN, Crystal                            Page, Sheran Fava Referring MD:              Medicines:                See the Anesthesia note for documentation of the                            administered medications Complications:            No immediate complications. Estimated Blood Loss:     Estimated blood loss: none. Procedure:                Pre-Anesthesia Assessment:                           - The anesthesia plan was to use monitored                            anesthesia care (MAC).                           After obtaining informed consent, the colonoscope                            was passed under direct vision. Throughout the                            procedure, the patient's blood pressure, pulse, and                            oxygen saturations were monitored continuously. The                            [id number]-HQ190L ([id number]) scope was introduced through                            the anus and advanced to the the cecum, identified                            by appendiceal orifice and ileocecal valve. The                            colonoscopy was performed without  difficulty. The                            patient tolerated the procedure well. The quality                            of the bowel preparation was evaluated using the                            BBPS [facility] Bowel Preparation Scale) with scores                            of: Right Colon = 3, Transverse Colon = 3 and Left                            Colon = 3  (entire mucosa seen well with no residual                            staining, small fragments of stool or opaque                            liquid). The total BBPS score equals 9. Scope In: 9:53:24 AM Scope Out: 10:06:53 AM Scope Withdrawal Time: 0 hours 10 minutes 38 seconds  Total Procedure Duration: 0 hours 13 minutes 29 seconds  Findings:      Non-bleeding internal hemorrhoids were found during endoscopy.      The exam was otherwise without abnormality. Impression:               - Non-bleeding internal hemorrhoids.                           - The examination was otherwise normal.                           - No specimens collected. Moderate Sedation:      Per Anesthesia Care Recommendation:           - Patient has a contact number available for                            emergencies. The signs and symptoms of potential                            delayed complications were discussed with the                            patient. Return to normal activities tomorrow.                            Written discharge instructions were provided to the                            patient.                           -  Resume previous diet.                           - Continue present medications.                           - Repeat colonoscopy in 5 years for surveillance.                           - Return to GI clinic PRN. Procedure Code(s):        --- Professional ---                           I4332, Colorectal cancer screening; colonoscopy on                            individual at high risk Diagnosis Code(s):        --- Professional ---                           Z86.010, Personal history of colonic polyps                           K64.8, Other hemorrhoids CPT copyright 2022 American Medical Association. All rights reserved. The codes documented in this report are preliminary and upon coder review may  be revised to meet current compliance requirements. Hennie Duos. Marletta Lor, DO Hennie Duos.  Marletta Lor, DO 07/02/2022 10:09:59 AM This report has been signed electronically. Number of Addenda: 0

## 2022-07-02 NOTE — H&P (Signed)
Primary Care Physician:  Ignatius Specking, MD Primary Gastroenterologist:  Dr. Marletta Lor  Pre-Procedure History & Physical: HPI:  Ashley Wong is a 57 y.o. female is here for a colonoscopy to be performed for surveillance purposes, personal history of adenomatous colon polyps in 2021  Past Medical History:  Diagnosis Date   Contraceptive management 11/19/2013   Fecal occult blood test positive 10/13/2015   Will sent 3 cards home   Hemorrhoids    Urinary frequency 12/18/2013    Past Surgical History:  Procedure Laterality Date   CESAREAN SECTION  2004   COLONOSCOPY N/A 12/05/2015   Procedure: COLONOSCOPY;  Surgeon: West Bali, MD;  Location: [facility];  Service: Endoscopy;  Laterality: N/A;  245   COLONOSCOPY N/A 04/23/2019   Procedure: COLONOSCOPY;  Surgeon: West Bali, MD;  Location: [facility];  Service: Endoscopy;  Laterality: N/A;  1:00   POLYPECTOMY  12/05/2015   Procedure: POLYPECTOMY;  Surgeon: West Bali, MD;  Location: [facility];  Service: Endoscopy;;  colon   POLYPECTOMY  04/23/2019   Procedure: POLYPECTOMY;  Surgeon: West Bali, MD;  Location: [facility];  Service: Endoscopy;;    Prior to Admission medications   Medication Sig Start Date End Date Taking? Authorizing Provider  ibuprofen (ADVIL) 200 MG tablet Take 400 mg by mouth every 8 (eight) hours as needed for headache or moderate pain.   Yes [provider]  Na Sulfate-K Sulfate-Mg Sulf 17.5-3.13-1.6 GM/177ML SOLN As directed 05/27/22   Lanelle Bal, DO  OVER THE COUNTER MEDICATION Take 2 capsules by mouth daily. herblax shaklee   Yes [provider]  Ferrous Sulfate (IRON PO) Take by mouth. Prior to blood donations    [provider]  Alain Honey Triphasic (ENPRESSE-28) 50-30/75-40/ 125-30 MCG TABS Take 1 tablet by mouth daily. Patient not taking: Reported on 06/30/2022 05/28/22   Adline Potter, NP    Allergies as of 05/27/2022 - Review Complete  05/26/2022  Allergen Reaction Noted   Penicillins Anaphylaxis 11/19/2013   Zithromax [azithromycin] Nausea And Vomiting 11/19/2013   Sulfa antibiotics Rash 11/19/2013    Family History  Problem Relation Age of Onset   Heart disease Mother    Colon cancer Mother 63       s/p surgery, no adjuvent therapy    Social History   Socioeconomic History   Marital status: Married    Spouse name: Not on file   Number of children: Not on file   Years of education: Not on file   Highest education level: Not on file  Occupational History   Not on file  Tobacco Use   Smoking status: Never   Smokeless tobacco: Never  Vaping Use   Vaping Use: Never used  Substance and Sexual Activity   Alcohol use: No   Drug use: No   Sexual activity: Yes    Birth control/protection: Pill  Other Topics Concern   Not on file  Social History Narrative   Teacher   Social Determinants of Health   Financial Resource Strain: Low Risk  (06/07/2022)   Overall Financial Resource Strain (CARDIA)    Difficulty of Paying Living Expenses: Not hard at all  Food Insecurity: No Food Insecurity (06/07/2022)   Hunger Vital Sign    Worried About Running Out of Food in the Last Year: Never true    Ran Out of Food in the Last Year: Never true  Transportation Needs: No Transportation Needs (06/07/2022)  PRAPARE - Administrator, Civil Service (Medical): No    Lack of Transportation (Non-Medical): No  Physical Activity: Sufficiently Active (06/07/2022)   Exercise Vital Sign    Days of Exercise per Week: 7 days    Minutes of Exercise per Session: 40 min  Stress: No Stress Concern Present (06/07/2022)   [employer] of Occupational Health - Occupational Stress Questionnaire    Feeling of Stress : Not at all  Social Connections: Socially Integrated (06/07/2022)   Social Connection and Isolation Panel [NHANES]    Frequency of Communication with Friends and Family: More than three times a week    Frequency of  Social Gatherings with Friends and Family: Three times a week    Attends Religious Services: More than 4 times per year    Active Member of Clubs or Organizations: Yes    Attends Banker Meetings: More than 4 times per year    Marital Status: Married  Catering manager Violence: Not At Risk (06/07/2022)   Humiliation, Afraid, Rape, and Kick questionnaire    Fear of Current or Ex-Partner: No    Emotionally Abused: No    Physically Abused: No    Sexually Abused: No    Review of Systems: See HPI, otherwise negative ROS  Physical Exam: Vital signs in last 24 hours: Temp:  [98.2 F (36.8 C)] 98.2 F (36.8 C) (05/31 0836) Pulse Rate:  [78] 78 (05/31 0836) Resp:  [15] 15 (05/31 0836) BP: (148)/(76) 148/76 (05/31 0836) SpO2:  [100 %] 100 % (05/31 0836) Weight:  [74.8 kg] 74.8 kg (05/31 0836)   General:   Alert,  Well-developed, well-nourished, pleasant and cooperative in NAD Head:  Normocephalic and atraumatic. Eyes:  Sclera clear, no icterus.   Conjunctiva pink. Ears:  Normal auditory acuity. Nose:  No deformity, discharge,  or lesions. Msk:  Symmetrical without gross deformities. Normal posture. Extremities:  Without clubbing or edema. Neurologic:  Alert and  oriented x4;  grossly normal neurologically. Skin:  Intact without significant lesions or rashes. Psych:  Alert and cooperative. Normal mood and affect.  Impression/Plan: Ashley Wong is here for a colonoscopy to be performed for surveillance purposes, personal history of adenomatous colon polyps in 2021  The risks of the procedure including infection, bleed, or perforation as well as benefits, limitations, alternatives and imponderables have been reviewed with the patient. Questions have been answered. All parties agreeable.

## 2022-07-02 NOTE — Transfer of Care (Signed)
Immediate Anesthesia Transfer of Care Note  Patient: Ashley Wong  Procedure(s) Performed: COLONOSCOPY WITH PROPOFOL  Patient Location: Short Stay  Anesthesia Type:General  Level of Consciousness: drowsy  Airway & Oxygen Therapy: Patient Spontanous Breathing  Post-op Assessment: Report given to RN and Post -op Vital signs reviewed and stable  Post vital signs: Reviewed and stable  Last Vitals:  Vitals Value Taken Time  BP 122/84 07/02/22 1010  Temp 36.7 C 07/02/22 1010  Pulse 93 07/02/22 1010  Resp 18 07/02/22 1010  SpO2 97 % 07/02/22 1010    Last Pain:  Vitals:   07/02/22 1010  TempSrc: Axillary  PainSc: 0-No pain         Complications: No notable events documented.

## 2022-07-02 NOTE — Anesthesia Preprocedure Evaluation (Signed)
Anesthesia Evaluation  Patient identified by MRN, date of birth, ID band Patient awake    Reviewed: Allergy & Precautions, H&P , NPO status , Patient's Chart, lab work & pertinent test results, reviewed documented beta blocker date and time   Airway Mallampati: II  TM Distance: >3 FB Neck ROM: full    Dental no notable dental hx.    Pulmonary neg pulmonary ROS   Pulmonary exam normal breath sounds clear to auscultation       Cardiovascular Exercise Tolerance: Good negative cardio ROS  Rhythm:regular Rate:Normal     Neuro/Psych negative neurological ROS  negative psych ROS   GI/Hepatic negative GI ROS, Neg liver ROS,,,  Endo/Other  negative endocrine ROS    Renal/GU negative Renal ROS  negative genitourinary   Musculoskeletal   Abdominal   Peds  Hematology negative hematology ROS (+)   Anesthesia Other Findings   Reproductive/Obstetrics negative OB ROS                             Anesthesia Physical Anesthesia Plan  ASA: 2  Anesthesia Plan: General   Post-op Pain Management:    Induction:   PONV Risk Score and Plan: Propofol infusion  Airway Management Planned:   Additional Equipment:   Intra-op Plan:   Post-operative Plan:   Informed Consent: I have reviewed the patients History and Physical, chart, labs and discussed the procedure including the risks, benefits and alternatives for the proposed anesthesia with the patient or authorized representative who has indicated his/her understanding and acceptance.     Dental Advisory Given  Plan Discussed with: CRNA  Anesthesia Plan Comments:        Anesthesia Quick Evaluation  

## 2022-07-02 NOTE — Discharge Instructions (Signed)
  Colonoscopy Discharge Instructions  Read the instructions outlined below and refer to this sheet in the next few weeks. These discharge instructions provide you with general information on caring for yourself after you leave the hospital. Your doctor may also give you specific instructions. While your treatment has been planned according to the most current medical practices available, unavoidable complications occasionally occur.   ACTIVITY You may resume your regular activity, but move at a slower pace for the next 24 hours.  Take frequent rest periods for the next 24 hours.  Walking will help get rid of the air and reduce the bloated feeling in your belly (abdomen).  No driving for 24 hours (because of the medicine (anesthesia) used during the test).   Do not sign any important legal documents or operate any machinery for 24 hours (because of the anesthesia used during the test).  NUTRITION Drink plenty of fluids.  You may resume your normal diet as instructed by your doctor.  Begin with a light meal and progress to your normal diet. Heavy or fried foods are harder to digest and may make you feel sick to your stomach (nauseated).  Avoid alcoholic beverages for 24 hours or as instructed.  MEDICATIONS You may resume your normal medications unless your doctor tells you otherwise.  WHAT YOU CAN EXPECT TODAY Some feelings of bloating in the abdomen.  Passage of more gas than usual.  Spotting of blood in your stool or on the toilet paper.  IF YOU HAD POLYPS REMOVED DURING THE COLONOSCOPY: No aspirin products for 7 days or as instructed.  No alcohol for 7 days or as instructed.  Eat a soft diet for the next 24 hours.  FINDING OUT THE RESULTS OF YOUR TEST Not all test results are available during your visit. If your test results are not back during the visit, make an appointment with your caregiver to find out the results. Do not assume everything is normal if you have not heard from your  caregiver or the medical facility. It is important for you to follow up on all of your test results.  SEEK IMMEDIATE MEDICAL ATTENTION IF: You have more than a spotting of blood in your stool.  Your belly is swollen (abdominal distention).  You are nauseated or vomiting.  You have a temperature over 101.  You have abdominal pain or discomfort that is severe or gets worse throughout the day.   Your colonoscopy was relatively unremarkable.  I did not find any polyps or evidence of colon cancer.  I recommend repeating colonoscopy in 5 years given your history of polyps prior.  Follow-up with GI as needed.   I hope you have a great rest of your week!  Rileigh Kawashima K. Malahki Gasaway, D.O. Gastroenterology and Hepatology [facility]  

## 2022-07-07 NOTE — Anesthesia Postprocedure Evaluation (Signed)
Anesthesia Post Note  Patient: Ashley Wong  Procedure(s) Performed: COLONOSCOPY WITH PROPOFOL  Patient location during evaluation: Phase II Anesthesia Type: General Level of consciousness: awake Pain management: pain level controlled Vital Signs Assessment: post-procedure vital signs reviewed and stable Respiratory status: spontaneous breathing and respiratory function stable Cardiovascular status: blood pressure returned to baseline and stable Postop Assessment: no headache and no apparent nausea or vomiting Anesthetic complications: no Comments: Late entry   No notable events documented.   Last Vitals:  Vitals:   07/02/22 0836 07/02/22 1010  BP: (!) 148/76 122/84  Pulse: 78 93  Resp: 15 18  Temp: 36.8 C 36.7 C  SpO2: 100% 97%    Last Pain:  Vitals:   07/02/22 1010  TempSrc: Axillary  PainSc: 0-No pain                 Windell Norfolk

## 2022-07-08 ENCOUNTER — Encounter (HOSPITAL_COMMUNITY): Payer: Self-pay | Admitting: Internal Medicine

## 2022-08-18 ENCOUNTER — Other Ambulatory Visit: Payer: Self-pay | Admitting: Adult Health

## 2022-08-18 DIAGNOSIS — Z780 Asymptomatic menopausal state: Secondary | ICD-10-CM

## 2022-08-18 NOTE — Progress Notes (Signed)
 Ck [facility]

## 2022-08-20 LAB — FOLLICLE STIMULATING HORMONE: FSH: 84.5 m[IU]/mL

## 2023-03-11 ENCOUNTER — Other Ambulatory Visit (HOSPITAL_COMMUNITY): Payer: Self-pay | Admitting: Obstetrics & Gynecology

## 2023-03-11 DIAGNOSIS — Z1231 Encounter for screening mammogram for malignant neoplasm of breast: Secondary | ICD-10-CM

## 2023-04-20 ENCOUNTER — Ambulatory Visit (HOSPITAL_COMMUNITY)
Admission: RE | Admit: 2023-04-20 | Discharge: 2023-04-20 | Disposition: A | Discharge status: Home / Self Care | Payer: Self-pay | Source: Ambulatory Visit | Attending: Obstetrics & Gynecology | Admitting: Obstetrics & Gynecology

## 2023-04-20 ENCOUNTER — Encounter (HOSPITAL_COMMUNITY): Payer: Self-pay

## 2023-04-20 DIAGNOSIS — Z1231 Encounter for screening mammogram for malignant neoplasm of breast: Secondary | ICD-10-CM | POA: Diagnosis present

## 2023-04-22 ENCOUNTER — Encounter: Payer: Self-pay | Admitting: Obstetrics & Gynecology

## 2023-04-24 IMAGING — MG MM DIGITAL SCREENING BILAT W/ TOMO AND CAD
6 of 12 series · 6 of 36 positions shown · non-contrast
Comparison: Previous exam(s).

ACR Breast Density Category a: The breast tissue is almost entirely
fatty.

CLINICAL DATA: Screening.

EXAM:
DIGITAL SCREENING BILATERAL MAMMOGRAM WITH TOMOSYNTHESIS AND CAD
TECHNIQUE: Bilateral screening digital craniocaudal and mediolateral oblique
mammograms were obtained. Bilateral screening digital breast
tomosynthesis was performed. The images were evaluated with
computer-aided detection.

[L MLO synth-2D (1 of 2)]
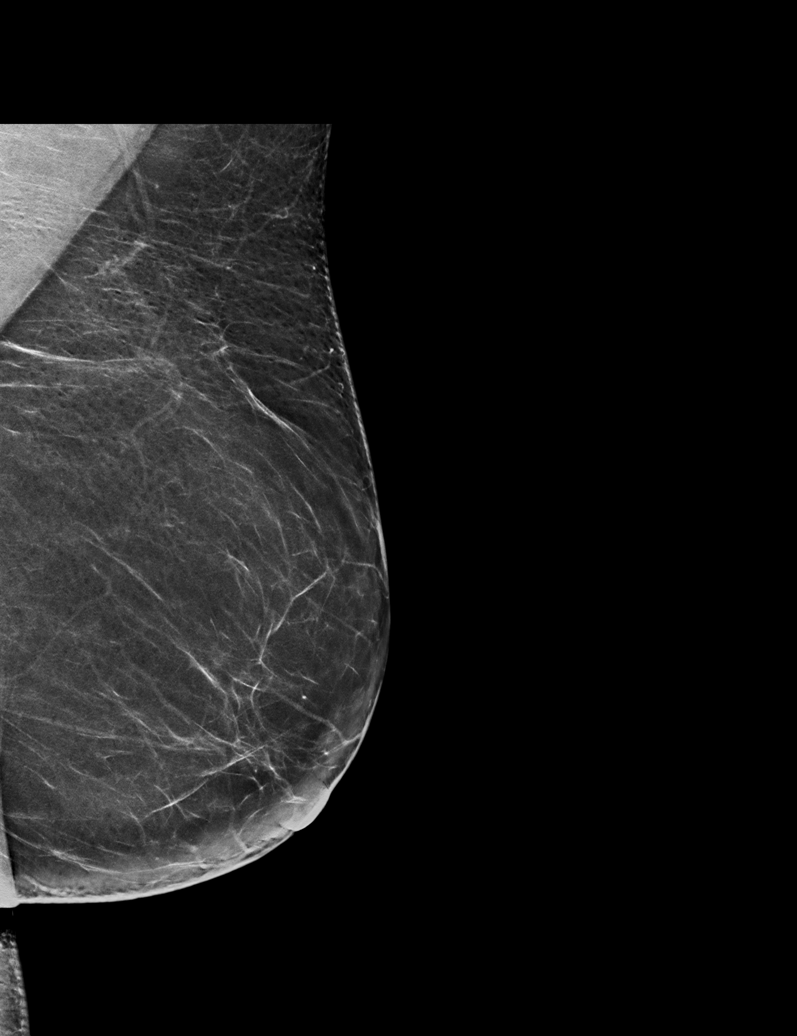

[L MLO synth-2D (2 of 2)]
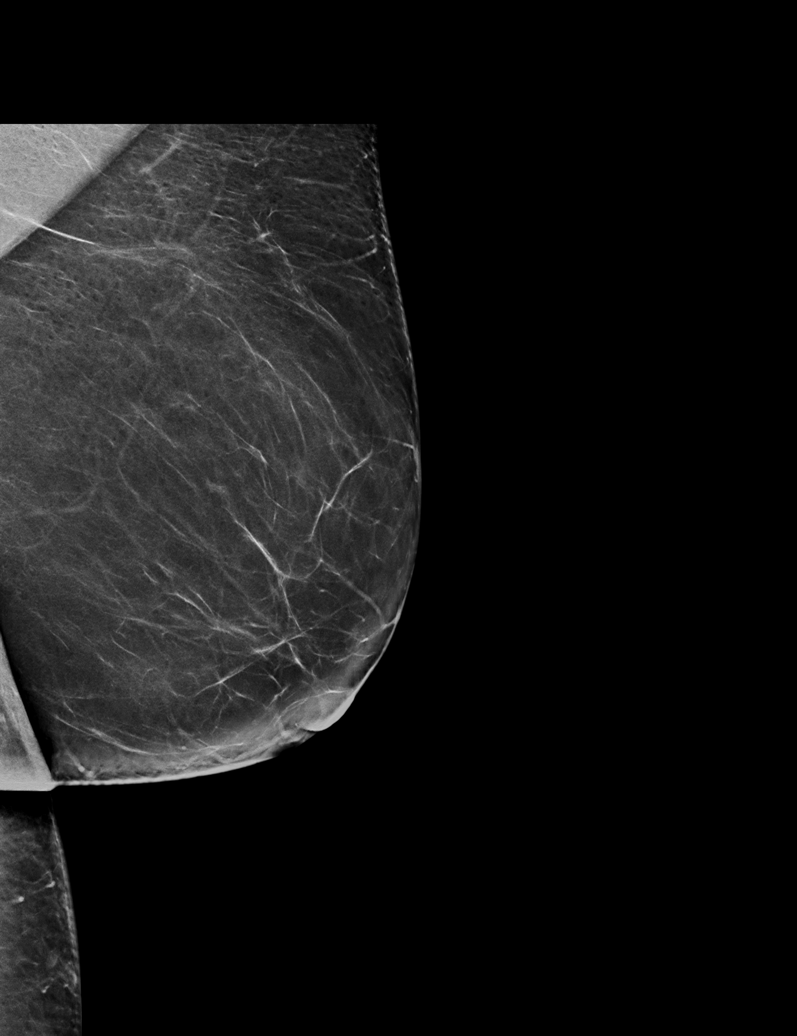

[R MLO synth-2D (1 of 2)]
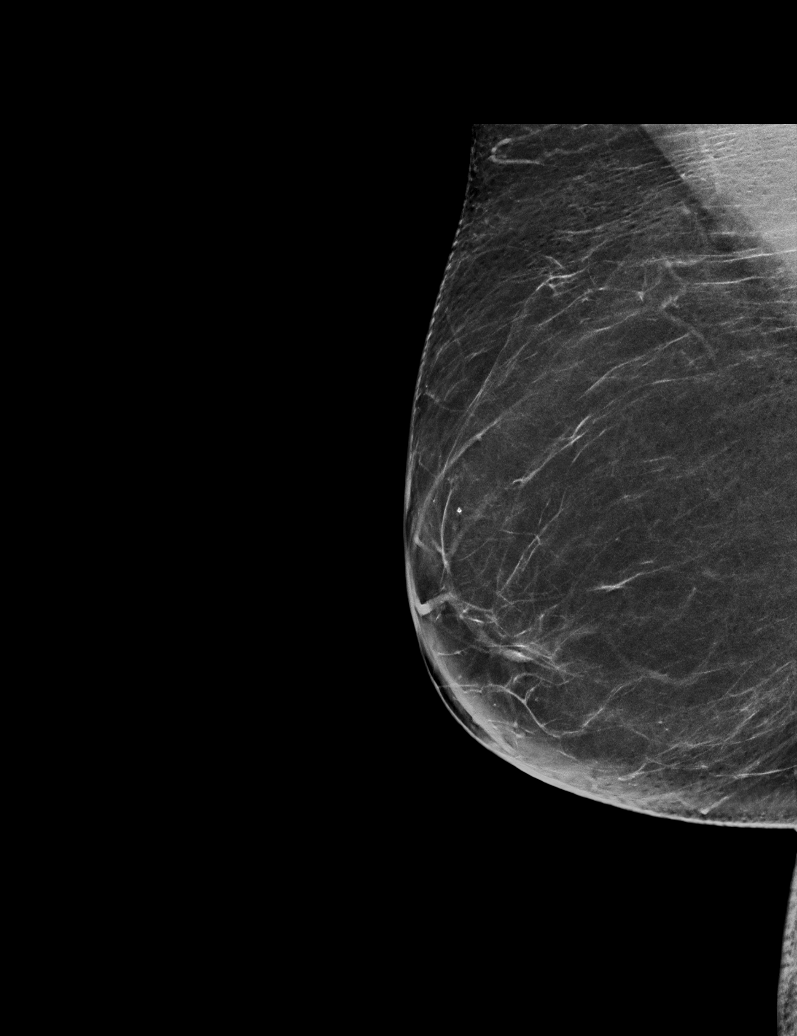

[L CC synth-2D]
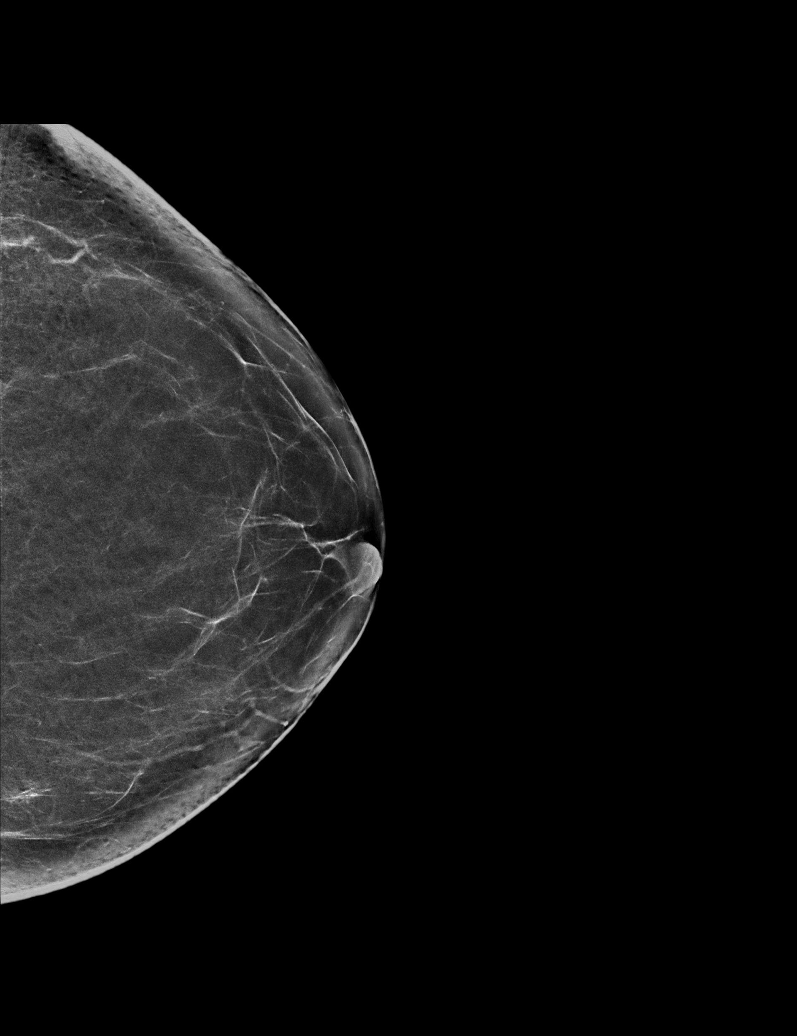

[R MLO synth-2D (2 of 2)]
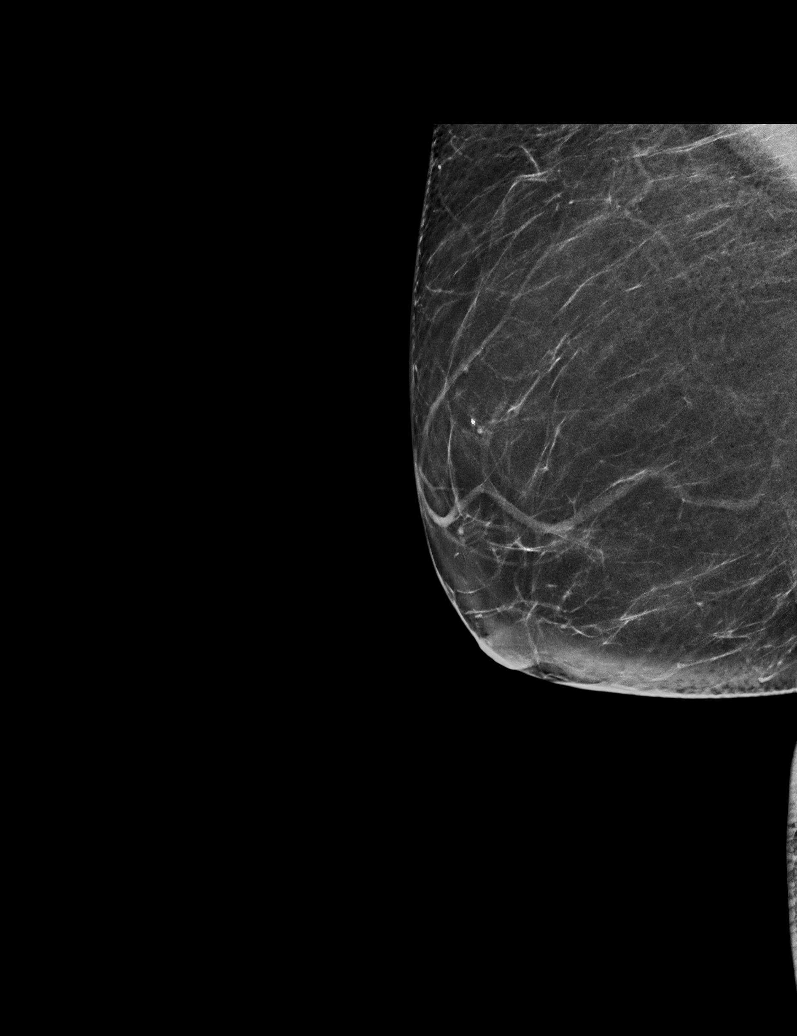

[R CC synth-2D]
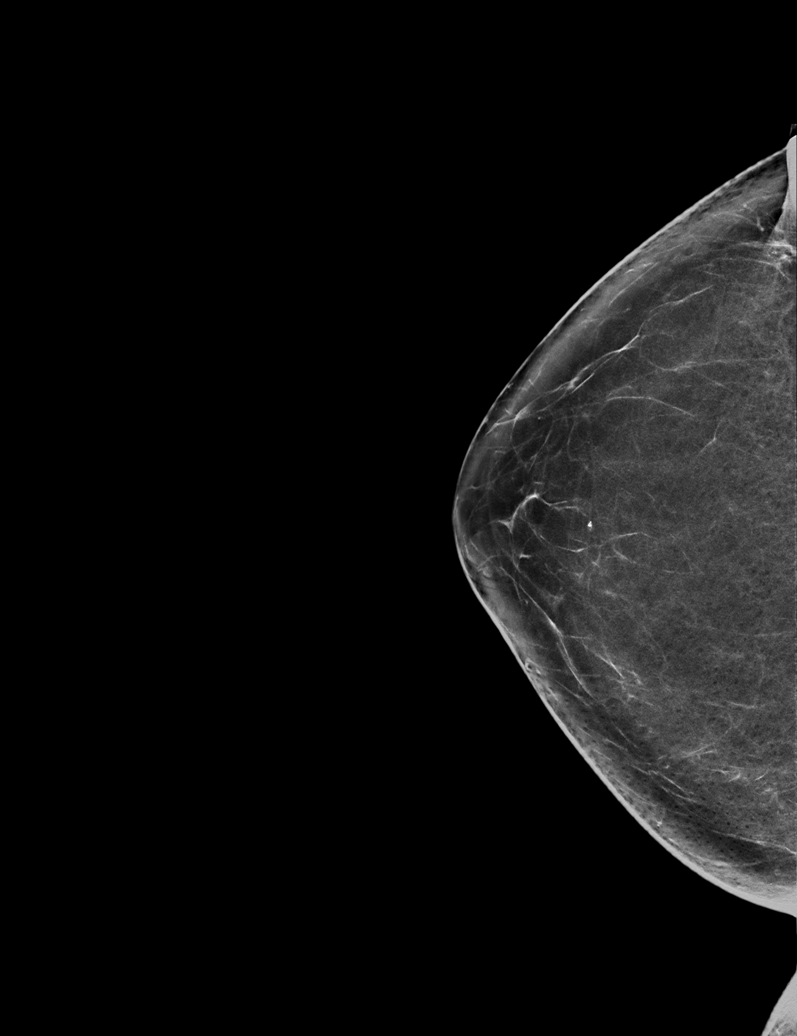

[6 of 36 positions shown; findings below may reference images not displayed]

FINDINGS: There are no findings suspicious for malignancy.
IMPRESSION: No mammographic evidence of malignancy. A result letter of this
screening mammogram will be mailed directly to the patient.

RECOMMENDATION:
Screening mammogram in one year. (Code:0E-3-N98)

BI-RADS CATEGORY  1: Negative.

## 2023-07-28 ENCOUNTER — Ambulatory Visit: Admitting: Adult Health

## 2023-09-08 ENCOUNTER — Ambulatory Visit: Admitting: Adult Health

## 2023-10-07 ENCOUNTER — Ambulatory Visit (INDEPENDENT_AMBULATORY_CARE_PROVIDER_SITE_OTHER): Admitting: Adult Health

## 2023-10-07 ENCOUNTER — Encounter: Payer: Self-pay | Admitting: Adult Health

## 2023-10-07 VITALS — BP 133/84 | HR 76 | Ht 60.0 in | Wt 174.5 lb

## 2023-10-07 DIAGNOSIS — Z13220 Encounter for screening for lipoid disorders: Secondary | ICD-10-CM | POA: Diagnosis not present

## 2023-10-07 DIAGNOSIS — Z01419 Encounter for gynecological examination (general) (routine) without abnormal findings: Secondary | ICD-10-CM | POA: Diagnosis not present

## 2023-10-07 NOTE — Progress Notes (Signed)
 Patient ID: Ashley Wong, female   DOB: 1965/11/17, 58 y.o.   MRN: [id number] History of Present Illness: Ashley Wong is a 58 year old white female, married, PM in for a well woman gyn exam. Since she stopped BCP has noted she wakes up at night and feels hot at times and joints ache more, esp if sat long time and in mornings.     Component Value Date/Time   DIAGPAP  06/07/2022 0844    - Negative for intraepithelial lesion or malignancy (NILM)   DIAGPAP  04/05/2019 9171    - Negative for intraepithelial lesion or malignancy (NILM)   DIAGPAP  12/06/2016 0000    NEGATIVE FOR INTRAEPITHELIAL LESIONS OR MALIGNANCY.   HPVHIGH Negative 06/07/2022 0844   HPVHIGH Negative 04/05/2019 0828   ADEQPAP  06/07/2022 0844    Satisfactory for evaluation; transformation zone component ABSENT.   ADEQPAP  04/05/2019 0828    Satisfactory for evaluation; transformation zone component ABSENT.   ADEQPAP  12/06/2016 0000    Satisfactory for evaluation  endocervical/transformation zone component ABSENT.     PCP is Dr Rosamond  Current Medications, Allergies, Past Medical History, Past Surgical History, Family History and Social History were reviewed in [employer] electronic medical record.     Review of Systems: Patient denies any headaches, hearing loss, fatigue, blurred vision, shortness of breath, chest pain, abdominal pain, problems with bowel movements, urination, or intercourse. No joint pain or mood swings.  Denies any vaginal bleeding See HPI for positives    Physical Exam:BP 133/84 (BP Location: Left Arm, Patient Position: Sitting, Cuff Size: Normal)   Pulse 76   Ht 5' (1.524 m)   Wt 174 lb 8 oz (79.2 kg)   LMP 06/05/2022 (Exact Date)   BMI 34.08 kg/m   General:  Well developed, well nourished, no acute distress Skin:  Warm and dry Neck:  Midline trachea, normal thyroid, good ROM, no lymphadenopathy Lungs; Clear to auscultation bilaterally Breast:  No dominant palpable mass, retraction, or  nipple discharge Cardiovascular: Regular rate and rhythm Abdomen:  Soft, non tender, no hepatosplenomegaly Pelvic:  External genitalia is normal in appearance, no lesions.  The vagina is normal in appearance. Urethra has no lesions or masses. The cervix is smooth.  Uterus is felt to be normal size, shape, and contour.  No adnexal masses or tenderness noted.Bladder is non tender, no masses felt. Rectal: Deferred  Extremities/musculoskeletal:  No swelling or varicosities noted, no clubbing or cyanosis Psych:  No mood changes, alert and cooperative,seems happy AA is 0 Fall risk is low    10/07/2023    8:49 AM 06/07/2022    8:37 AM 05/25/2021   11:00 AM  Depression screen PHQ 2/9  Decreased Interest 0 0 0  Down, Depressed, Hopeless 0 0 0  PHQ - 2 Score 0 0 0  Altered sleeping 2 0 0  Tired, decreased energy 1 0 0  Change in appetite 0 0 0  Feeling bad or failure about yourself  0 0 0  Trouble concentrating 0 0 0  Moving slowly or fidgety/restless 0 0 0  Suicidal thoughts 0 0 0  PHQ-9 Score 3 0 0       10/07/2023    8:49 AM 06/07/2022    8:38 AM 05/25/2021   11:00 AM 05/21/2020    8:38 AM  GAD 7 : Generalized Anxiety Score  Nervous, Anxious, on Edge 0 0 0 0  Control/stop worrying 0 0 0 0  Worry too much - different  things 0 0 0 0  Trouble relaxing 0 0 0 0  Restless 0 0 0 0  Easily annoyed or irritable 0 0 0 0  Afraid - awful might happen 0 0 0 0  Total GAD 7 Score 0 0 0 0    Upstream - 10/07/23 0836       Pregnancy Intention Screening   Does the patient want to become pregnant in the next year? N/A    Does the patient's partner want to become pregnant in the next year? N/A    Would the patient like to discuss contraceptive options today? N/A      Contraception Wrap Up   Current Method No Method - Other Reason   PM   End Method No Method - Other Reason   PM   Contraception Counseling Provided No         Examination chaperoned by Clarita Salt LPN    Impression and plan: 1.  Encounter for well woman exam with routine gynecological exam (Primary) Physical in 1 year Pap in 2027 Mammogram was negative 04/20/23 Colonoscopy per GI Will check labs fasting in near future she has orders  - CBC - Comprehensive metabolic panel with GFR - Lipid panel Stay active Take 4000-5000IU every day Magnesium is Ok too  Try to eat lean and green, she has 1 diet soda a day   2. Screening cholesterol level - Lipid panel

## 2023-10-26 ENCOUNTER — Ambulatory Visit: Payer: Self-pay | Admitting: Adult Health

## 2023-10-26 LAB — COMPREHENSIVE METABOLIC PANEL WITH GFR
ALT: 16 IU/L (ref 0–32)
AST: 20 IU/L (ref 0–40)
Albumin: 4.3 g/dL (ref 3.8–4.9)
Alkaline Phosphatase: 113 IU/L (ref 49–135)
BUN/Creatinine Ratio: 22 (ref 9–23)
BUN: 16 mg/dL (ref 6–24)
Bilirubin Total: 0.4 mg/dL (ref 0.0–1.2)
CO2: 22 mmol/L (ref 20–29)
Calcium: 9.7 mg/dL (ref 8.7–10.2)
Chloride: 106 mmol/L (ref 96–106)
Creatinine, Ser: 0.73 mg/dL (ref 0.57–1.00)
Globulin, Total: 2.3 g/dL (ref 1.5–4.5)
Glucose: 81 mg/dL (ref 70–99)
Potassium: 4.2 mmol/L (ref 3.5–5.2)
Sodium: 143 mmol/L (ref 134–144)
Total Protein: 6.6 g/dL (ref 6.0–8.5)
eGFR: 96 mL/min/1.73 (ref >59)

## 2023-10-26 LAB — LIPID PANEL
Chol/HDL Ratio: 3.8 ratio (ref 0.0–4.4)
Cholesterol, Total: 182 mg/dL (ref 100–199)
HDL: 48 mg/dL (ref >39)
LDL Chol Calc (NIH): 117 mg/dL — ABNORMAL HIGH (ref 0–99)
Triglycerides: 94 mg/dL (ref 0–149)
VLDL Cholesterol Cal: 17 mg/dL (ref 5–40)

## 2023-10-26 LAB — CBC
Hematocrit: 43.2 % (ref 34.0–46.6)
Hemoglobin: 13.8 g/dL (ref 11.1–15.9)
MCH: 29.1 pg (ref 26.6–33.0)
MCHC: 31.9 g/dL (ref 31.5–35.7)
MCV: 91 fL (ref 79–97)
Platelets: 229 x10E3/uL (ref 150–450)
RBC: 4.74 x10E6/uL (ref 3.77–5.28)
RDW: 13.2 % (ref 11.7–15.4)
WBC: 5.3 x10E3/uL (ref 3.4–10.8)
